# Patient Record
Sex: Female | Born: 1960
Health system: Southern US, Community
[De-identification: ages and names within clinical notes are randomized; demographics above are authoritative.]

## PROBLEM LIST (undated history)

## (undated) DIAGNOSIS — F17200 Nicotine dependence, unspecified, uncomplicated: Secondary | ICD-10-CM

## (undated) DIAGNOSIS — F419 Anxiety disorder, unspecified: Secondary | ICD-10-CM

## (undated) HISTORY — PX: ENDOMETRIAL ABLATION: SHX621

## (undated) HISTORY — PX: TUBAL LIGATION: SHX77

## (undated) HISTORY — DX: Nicotine dependence, unspecified, uncomplicated: F17.200

## (undated) HISTORY — DX: Anxiety disorder, unspecified: F41.9

---

## 2002-04-16 ENCOUNTER — Ambulatory Visit (HOSPITAL_COMMUNITY): Admission: RE | Admit: 2002-04-16 | Discharge: 2002-04-16 | Payer: Self-pay | Admitting: Obstetrics and Gynecology

## 2003-08-01 ENCOUNTER — Ambulatory Visit (HOSPITAL_COMMUNITY): Admission: RE | Admit: 2003-08-01 | Discharge: 2003-08-01 | Payer: Self-pay | Admitting: Family Medicine

## 2006-11-23 ENCOUNTER — Other Ambulatory Visit: Admission: RE | Admit: 2006-11-23 | Discharge: 2006-11-23 | Payer: Self-pay | Admitting: Obstetrics and Gynecology

## 2006-11-28 ENCOUNTER — Ambulatory Visit (HOSPITAL_COMMUNITY): Admission: RE | Admit: 2006-11-28 | Discharge: 2006-11-28 | Payer: Self-pay | Admitting: Obstetrics & Gynecology

## 2006-12-20 ENCOUNTER — Encounter: Payer: Self-pay | Admitting: Obstetrics & Gynecology

## 2006-12-21 ENCOUNTER — Ambulatory Visit (HOSPITAL_COMMUNITY): Admission: RE | Admit: 2006-12-21 | Discharge: 2006-12-21 | Payer: Self-pay | Admitting: Obstetrics & Gynecology

## 2008-03-13 ENCOUNTER — Other Ambulatory Visit: Admission: RE | Admit: 2008-03-13 | Discharge: 2008-03-13 | Payer: Self-pay | Admitting: Obstetrics and Gynecology

## 2010-02-20 ENCOUNTER — Other Ambulatory Visit: Payer: Self-pay | Admitting: Obstetrics and Gynecology

## 2010-02-20 DIAGNOSIS — Z139 Encounter for screening, unspecified: Secondary | ICD-10-CM

## 2010-02-26 ENCOUNTER — Ambulatory Visit (HOSPITAL_COMMUNITY)
Admission: RE | Admit: 2010-02-26 | Discharge: 2010-02-26 | Disposition: A | Discharge status: Home / Self Care | Payer: PRIVATE HEALTH INSURANCE | Source: Ambulatory Visit | Attending: Obstetrics and Gynecology | Admitting: Obstetrics and Gynecology

## 2010-02-26 DIAGNOSIS — Z139 Encounter for screening, unspecified: Secondary | ICD-10-CM

## 2010-02-26 DIAGNOSIS — Z1231 Encounter for screening mammogram for malignant neoplasm of breast: Secondary | ICD-10-CM | POA: Insufficient documentation

## 2010-04-16 ENCOUNTER — Other Ambulatory Visit: Payer: Self-pay | Admitting: Adult Health

## 2010-04-16 ENCOUNTER — Other Ambulatory Visit (HOSPITAL_COMMUNITY)
Admission: RE | Admit: 2010-04-16 | Discharge: 2010-04-16 | Disposition: A | Discharge status: Home / Self Care | Payer: PRIVATE HEALTH INSURANCE | Source: Ambulatory Visit | Attending: Obstetrics and Gynecology | Admitting: Obstetrics and Gynecology

## 2010-04-16 DIAGNOSIS — Z01419 Encounter for gynecological examination (general) (routine) without abnormal findings: Secondary | ICD-10-CM | POA: Insufficient documentation

## 2010-05-19 NOTE — Op Note (Signed)
NAME:  Stephanie Stevenson, Stephanie Stevenson              ACCOUNT NO.:  [id number]   MEDICAL RECORD NO.:  [id number]          PATIENT TYPE:  AMB   LOCATION:  DAY                           FACILITY:  [facility]   PHYSICIAN:  Lazaro Arms, M.D.   DATE OF BIRTH:  03/01/1960   DATE OF PROCEDURE:  12/21/2006  DATE OF DISCHARGE:                               OPERATIVE REPORT   PREOPERATIVE DIAGNOSES:  1. Menometrorrhagia.  2. Dysmenorrhea.   POSTOPERATIVE DIAGNOSES:  1. Menometrorrhagia.  2. Dysmenorrhea.   PROCEDURE:  Hysteroscopy, dilatation and curettage, endometrial  ablation.   SURGEON:  Lazaro Arms, M.D.   ANESTHESIA:  General endotracheal.   FINDINGS:  Patient had a normal endometrium.  No polyps, no fibroids, no  abnormalities.   DESCRIPTION OF OPERATION:  Patient was taken to the operating room,  placed in supine position, underwent general endotracheal anesthesia,  placed in dorsal lithotomy position, prepped and draped in the usual  sterile fashion.  Graves speculum was placed, cervix was grasped,  dilated serially to allow passage of hysteroscope.  Diagnostic  hysteroscopy was performed.  The above-noted findings were seen.  Vigorous uterine curettage was performed.  Curettings were sent to  pathology for evaluation.   ThermaChoice 3 endometrial ablation balloon was used; 14 mL of D5W were  required to maintain a pressure between 190 and 200 mmHg throughout the  procedure, heated to 88 degrees, Celsius.  Total therapy time was 9  minutes and 4 seconds.   The patient tolerated the procedure well.  She experienced minimal blood  loss.  She was taken to the recovery room in good, stable condition.  All counts correct.  She received Ancef and Toradol preoperatively.      Lazaro Arms, M.D.  Electronically Signed    LHE/MEDQ  D:  12/21/2006  T:  12/21/2006  Job:  [id number]

## 2010-05-22 NOTE — Op Note (Signed)
NAME:  Stephanie Stevenson, Stephanie Stevenson                        ACCOUNT NO.:  [id number]   MEDICAL RECORD NO.:  [id number]                   PATIENT TYPE:  AMB   LOCATION:  DAY                                  FACILITY[facility]  [facility]   PHYSICIAN:  Tilda Burrow, M.D.              DATE OF BIRTH:  November 28, 1960   DATE OF PROCEDURE:  04/16/2002  DATE OF DISCHARGE:                                 OPERATIVE REPORT   PREOPERATIVE DIAGNOSIS:  Elective sterilization.   POSTOPERATIVE DIAGNOSIS:  Elective sterilization.   PROCEDURE:  Laparoscopic tubal sterilization with Falope rings.   SURGEON:  Christin Bach, M.D.   ASSISTANT:  None.   ANESTHESIA:  General.   COMPLICATIONS:  None   FINDINGS:  Thin, long term line of adhesions from the inferior aspects of  the left ovary to the sigmoid colon but these are longstanding, smooth,  symmetric and allow mobility of both ovary and colon so they were left  alone.   DETAILS OF PROCEDURE:  The patient was taken to the operating room, prepped  and  draped for a combined abdominal and vaginal procedure, with Hulka  tenaculum attached to the cervix for uterine  manipulation. Bladder in-and-  out catheterization. An infraumbilical, 1 cm vertical incision, as well as a  transverse suprapubic 1 cm incision. Veress needle was used to introduce  pneumoperitoneum through the umbilical incision with the pneumoperitoneum  easily introduced under 10 mmHg of pressure. Introduction of the Veress  needle was done, carefully elevating the abdominal wall and orienting the  needle toward the pelvis.   The laparoscopic trocar was then carefully introduced into the abdomen using  a similar technique, and the laparoscope was used to visualize normal pelvic  anatomy with no evidence of bleeding or trauma. The suprapubic trocar was  introduced under direct visualization, and then attention was directed to  the left fallopian tube, which was identified up to its fimbriated end,  elevated and a mid-segment loop of the tube was drawn up into the Falope  ring applier, Marcaine 0.25% applied to the surface of the tube and the  Falope ring applied, inspected, and found to be in satisfactory position.  The opposite tube was then treated in a similar fashion. The mesosalpinx  beneath the Falope ring on each side was then infiltrated with approximately  3 cc of Marcaine 0.25%, using a transabdominal approach with a 22-gauge  spinal needle. Then the laparoscopic equipment was removed after instilling  200 cc of saline into  the abdomen and deflating the abdomen. Subcuticular 4-0 Dexon was used to  close the skin and Steri-Strips were placed on the skin surface. Sponge and  needle counts were correct. The patient tolerated the procedure well, was  awakened, and went to the recovery room in good condition.  Tilda Burrow, M.D.    JVF/MEDQ  D:  04/16/2002  T:  04/16/2002  Job:  [id number]

## 2010-10-09 LAB — URINALYSIS, ROUTINE W REFLEX MICROSCOPIC
Protein, ur: NEGATIVE
Urobilinogen, UA: 0.2

## 2010-10-09 LAB — CBC
HCT: 42.2
Hemoglobin: 14.2
MCHC: 33.8
MCV: 94.9
Platelets: 260
RBC: 4.44
RDW: 13.3
WBC: 9

## 2010-10-09 LAB — COMPREHENSIVE METABOLIC PANEL
ALT: 16
AST: 17
Albumin: 3.8
CO2: 26
Calcium: 9
Chloride: 105
Creatinine, Ser: 0.7
Potassium: 4
Total Bilirubin: 0.4
Total Protein: 6.6

## 2011-02-11 ENCOUNTER — Other Ambulatory Visit: Payer: Self-pay | Admitting: Adult Health

## 2011-02-11 DIAGNOSIS — Z139 Encounter for screening, unspecified: Secondary | ICD-10-CM

## 2011-03-01 ENCOUNTER — Ambulatory Visit (HOSPITAL_COMMUNITY)
Admission: RE | Admit: 2011-03-01 | Discharge: 2011-03-01 | Disposition: A | Discharge status: Home / Self Care | Payer: PRIVATE HEALTH INSURANCE | Source: Ambulatory Visit | Attending: Adult Health | Admitting: Adult Health

## 2011-03-01 DIAGNOSIS — Z1231 Encounter for screening mammogram for malignant neoplasm of breast: Secondary | ICD-10-CM | POA: Insufficient documentation

## 2011-03-01 DIAGNOSIS — Z139 Encounter for screening, unspecified: Secondary | ICD-10-CM

## 2011-06-08 ENCOUNTER — Other Ambulatory Visit: Payer: Self-pay | Admitting: Adult Health

## 2011-06-08 ENCOUNTER — Other Ambulatory Visit (HOSPITAL_COMMUNITY)
Admission: RE | Admit: 2011-06-08 | Discharge: 2011-06-08 | Disposition: A | Discharge status: Home / Self Care | Payer: PRIVATE HEALTH INSURANCE | Source: Ambulatory Visit | Attending: Obstetrics and Gynecology | Admitting: Obstetrics and Gynecology

## 2011-06-08 DIAGNOSIS — Z1159 Encounter for screening for other viral diseases: Secondary | ICD-10-CM | POA: Insufficient documentation

## 2011-06-08 DIAGNOSIS — Z01419 Encounter for gynecological examination (general) (routine) without abnormal findings: Secondary | ICD-10-CM | POA: Insufficient documentation

## 2012-06-06 ENCOUNTER — Other Ambulatory Visit: Payer: Self-pay | Admitting: Adult Health

## 2012-06-06 DIAGNOSIS — Z139 Encounter for screening, unspecified: Secondary | ICD-10-CM

## 2012-06-08 ENCOUNTER — Ambulatory Visit (HOSPITAL_COMMUNITY)
Admission: RE | Admit: 2012-06-08 | Discharge: 2012-06-08 | Disposition: A | Discharge status: Home / Self Care | Payer: BC Managed Care – PPO | Source: Ambulatory Visit | Attending: Adult Health | Admitting: Adult Health

## 2012-06-08 DIAGNOSIS — Z139 Encounter for screening, unspecified: Secondary | ICD-10-CM

## 2012-06-08 DIAGNOSIS — Z1231 Encounter for screening mammogram for malignant neoplasm of breast: Secondary | ICD-10-CM | POA: Insufficient documentation

## 2012-08-03 ENCOUNTER — Ambulatory Visit (INDEPENDENT_AMBULATORY_CARE_PROVIDER_SITE_OTHER): Payer: BC Managed Care – PPO | Admitting: Adult Health

## 2012-08-03 ENCOUNTER — Encounter: Payer: Self-pay | Admitting: Adult Health

## 2012-08-03 VITALS — BP 120/80 | HR 74 | Ht 58.5 in | Wt 159.0 lb

## 2012-08-03 DIAGNOSIS — Z1212 Encounter for screening for malignant neoplasm of rectum: Secondary | ICD-10-CM

## 2012-08-03 DIAGNOSIS — Z01419 Encounter for gynecological examination (general) (routine) without abnormal findings: Secondary | ICD-10-CM

## 2012-08-03 DIAGNOSIS — F419 Anxiety disorder, unspecified: Secondary | ICD-10-CM | POA: Insufficient documentation

## 2012-08-03 DIAGNOSIS — F17200 Nicotine dependence, unspecified, uncomplicated: Secondary | ICD-10-CM

## 2012-08-03 HISTORY — DX: Nicotine dependence, unspecified, uncomplicated: F17.200

## 2012-08-03 HISTORY — DX: Anxiety disorder, unspecified: F41.9

## 2012-08-03 LAB — HEMOCCULT GUIAC POC 1CARD (OFFICE): Fecal Occult Blood, POC: NEGATIVE

## 2012-08-03 MED ORDER — ALPRAZOLAM 0.5 MG PO TABS: 0.5000 mg | ORAL_TABLET | Freq: Every evening | ORAL | Status: DC | PRN

## 2012-08-03 NOTE — Progress Notes (Signed)
Patient ID: Stephanie Stevenson, female   DOB: 28-Oct-1960, 52 y.o.   MRN: [id number] History of Present Illness: Stephanie Stevenson is a 8 year old white female married in for a pap and physical. Had normal pap with negative HPV in 2013 and normal mammogram this June. She has smoked for years and not ready to quit, she has lost 21 pounds since last visit.  Current Medications, Allergies, Past Medical History, Past Surgical History, Family History and Social History were reviewed in [employer] record.     Review of Systems: Patient denies any headaches, blurred vision, shortness of breath, chest pain, abdominal pain, problems with bowel movements, urination, or intercourse, not having sex husband has elevated BP and ED.Marland Kitchen No joint pains she is having some anxiety and is teary at times.    Physical Exam:BP 120/80  Pulse 74  Ht 4' 10.5" (1.486 m)  Wt 159 lb (72.122 kg)  BMI 32.66 kg/m2 General:  Well developed, well nourished, no acute distress Skin:  Warm and dry.tan Neck:  Midline trachea, normal thyroid Lungs; Clear to auscultation bilaterally Breast:  No dominant palpable mass, retraction, or nipple discharge Cardiovascular: Regular rate and rhythm Abdomen:  Soft, non tender, no hepatosplenomegaly Pelvic:  External genitalia is normal in appearance.  The vagina is normal in appearance. The cervix is smooth.  Uterus is felt to be normal size, shape, and contour.  No  adnexal masses or tenderness noted. Rectal: Good sphincter tone, no polyps, or hemorrhoids felt.  Hemoccult negative. Extremities:  No swelling or varicosities noted Psych:  Alert and cooperative,seems happy but has anxiety  Impression: Yearly gyn exam-no pap Anxiety Nicotine addiction   Plan: Physical in 1 year Mammogram yearly Colonoscopy at 52  Fasting labs next year Rx xanax 0.5 mg #30 1 at hs prn with 1 refill

## 2012-08-03 NOTE — Patient Instructions (Addendum)
Anxiety and Panic Attacks Your caregiver has informed you that you are having an anxiety or panic attack. There may be many forms of this. Most of the time these attacks come suddenly and without warning. They come at any time of day, including periods of sleep, and at any time of life. They may be strong and unexplained. Although panic attacks are very scary, they are physically harmless. Sometimes the cause of your anxiety is not known. Anxiety is a protective mechanism of the body in its fight or flight mechanism. Most of these perceived danger situations are actually nonphysical situations (such as anxiety over losing a job). CAUSES  The causes of an anxiety or panic attack are many. Panic attacks may occur in otherwise healthy people given a certain set of circumstances. There may be a genetic cause for panic attacks. Some medications may also have anxiety as a side effect. SYMPTOMS  Some of the most common feelings are:  Intense terror.  Dizziness, feeling faint.  Hot and cold flashes.  Fear of going crazy.  Feelings that nothing is real.  Sweating.  Shaking.  Chest pain or a fast heartbeat (palpitations).  Smothering, choking sensations.  Feelings of impending doom and that death is near.  Tingling of extremities, this may be from over-breathing.  Altered reality (derealization).  Being detached from yourself (depersonalization). Several symptoms can be present to make up anxiety or panic attacks. DIAGNOSIS  The evaluation by your caregiver will depend on the type of symptoms you are experiencing. The diagnosis of anxiety or panic attack is made when no physical illness can be determined to be a cause of the symptoms. TREATMENT  Treatment to prevent anxiety and panic attacks may include:  Avoidance of circumstances that cause anxiety.  Reassurance and relaxation.  Regular exercise.  Relaxation therapies, such as yoga.  Psychotherapy with a psychiatrist or  therapist.  Avoidance of caffeine, alcohol and illegal drugs.  Prescribed medication. SEEK IMMEDIATE MEDICAL CARE IF:   You experience panic attack symptoms that are different than your usual symptoms.  You have any worsening or concerning symptoms. Document Released: 12/21/2004 Document Revised: 03/15/2011 Document Reviewed: 04/24/2009 [facility] Patient Information 2014 [place], [place]. Xanax 1 daily prn Physical in 1 year Mammogram yearly Colonoscopy at 52

## 2013-06-01 ENCOUNTER — Emergency Department (HOSPITAL_COMMUNITY)
Admission: EM | Admit: 2013-06-01 | Discharge: 2013-06-02 | Disposition: A | Discharge status: Home / Self Care | Payer: BC Managed Care – PPO | Attending: Emergency Medicine | Admitting: Emergency Medicine

## 2013-06-01 ENCOUNTER — Encounter (HOSPITAL_COMMUNITY): Payer: Self-pay | Admitting: Emergency Medicine

## 2013-06-01 DIAGNOSIS — F17200 Nicotine dependence, unspecified, uncomplicated: Secondary | ICD-10-CM | POA: Insufficient documentation

## 2013-06-01 DIAGNOSIS — J029 Acute pharyngitis, unspecified: Secondary | ICD-10-CM | POA: Insufficient documentation

## 2013-06-01 DIAGNOSIS — R05 Cough: Secondary | ICD-10-CM | POA: Insufficient documentation

## 2013-06-01 DIAGNOSIS — F411 Generalized anxiety disorder: Secondary | ICD-10-CM | POA: Insufficient documentation

## 2013-06-01 DIAGNOSIS — H938X9 Other specified disorders of ear, unspecified ear: Secondary | ICD-10-CM | POA: Insufficient documentation

## 2013-06-01 DIAGNOSIS — L299 Pruritus, unspecified: Secondary | ICD-10-CM

## 2013-06-01 DIAGNOSIS — R059 Cough, unspecified: Secondary | ICD-10-CM | POA: Insufficient documentation

## 2013-06-01 NOTE — ED Notes (Signed)
Right ear ache for one week, "feels like somethings moving around in there"

## 2013-06-02 MED ORDER — ANTIPYRINE-BENZOCAINE 5.4-1.4 % OT SOLN
3.0000 [drp] | Freq: Once | OTIC | Status: AC
  Administered 2013-06-02: 4 [drp] via OTIC
  Filled 2013-06-02: qty 10

## 2013-06-02 NOTE — Discharge Instructions (Signed)
You may use the drop given if this relieves your symptoms, 3-4 drops every 4 hours for itch relief.     Emergency Department Resource Guide 1) Find a Doctor and Pay Out of Pocket Although you won't have to find out who is covered by your insurance plan, it is a good idea to ask around and get recommendations. You will then need to call the office and see if the doctor you have chosen will accept you as a new patient and what types of options they offer for patients who are self-pay. Some doctors offer discounts or will set up payment plans for their patients who do not have insurance, but you will need to ask so you aren't surprised when you get to your appointment.  2) Contact Your Local Health Department Not all health departments have doctors that can see patients for sick visits, but many do, so it is worth a call to see if yours does. If you don't know where your local health department is, you can check in your phone book. The CDC also has a tool to help you locate your state's health department, and many state websites also have listings of all of their local health departments.  3) Find a Walk-in Clinic If your illness is not likely to be very severe or complicated, you may want to try a walk in clinic. These are popping up all over the country in pharmacies, drugstores, and shopping centers. They're usually staffed by nurse practitioners or physician assistants that have been trained to treat common illnesses and complaints. They're usually fairly quick and inexpensive. However, if you have serious medical issues or chronic medical problems, these are probably not your best option.  No Primary Care Doctor: - Call Health Connect at  [phone] - they can help you locate a primary care doctor that  accepts your insurance, provides certain services, etc. - Physician Referral Service- [phone]  Chronic Pain Problems: Organization         Address  Phone   Notes  [facility]  [phone] Patients need to be referred by their primary care doctor.   Medication Assistance: Organization         Address  Phone   Notes  [facility] [address] [place] [place], [place] [address] [phone] --Must be a resident of [facility] -- Must have NO insurance coverage whatsoever (no Medicaid/ Medicare, etc.) -- The pt. MUST have a primary care doctor that directs their care regularly and follows them in the community   [facility]ist  [phone]   [employer]  [phone]    Agencies that provide inexpensive medical care: Organization         Address  Phone   Notes  [facility]  [phone]   [facility]    [phone]   [facility] [address] [place], [place] [address] [phone]   [facility] [address] [place], [place] [phone]   Planned Parenthood    [phone]   [facility]    [phone]   [facility]  [address], [place] Phone:  [phone], Fax:  [phone] Hours of Operation:  9 am - 6 pm, M-F.  Also accepts Medicaid/Medicare and self-pay.  [facility]  [address], [place] Phone: [phone], Fax: [phone]. Hours of Operation:  8:30 am - 5:30 pm, M-F.  Also accepts Medicaid and self-pay.  [facility] [place] [address] [place] Phone: [phone]   [facility] [address] Natasha Bence [place], [place] [phone], Ext. [phone] Mondays & Thursdays: 7-9 AM.  First 15 patients are seen on a first come, first serve basis.    Medicaid-accepting [facility] Providers:  Organization         Address  Phone   Notes  [facility] [address], [place] [phone] Also accepts self-pay patients.  [facility] [address] Laurell Josephs [place],  [place]  [phone]   [facility] [address], [place] [phone]   [facility] [address], [place] [phone]   Renaye Rakers [address], [place]   [phone] Only accepts [place] Access [place] patients after they have their name applied to their card.   Self-Pay (no insurance) in [facility]:  Organization         Address  Phone   Notes  Sickle Cell Patients, [facility] [address] [place], [place] [phone]   [facility] [address] [place], [place] [phone]   [facility] [place]  [address], [place] [phone]   [facility]/Dr. Osei-Bonsu  [address] [place] or [address], [place] [phone] Phone number for both [place] and [place] locations is the same.  [facility]gent [facility] and [facility] [address], [place] [phone]   [facility] [address] [place] or [address] [phone]   [facility] [address], [place] [phone], phone; [phone], fax Sees patients 1st and 3rd Saturday of every month.  Must not qualify for public or private insurance (i.e. Medicaid, Medicare, [place] Health Choice, Veterans' Benefits)  Household income should be no more than 200% of the poverty level The clinic cannot treat you if you are pregnant or think you are pregnant  Sexually transmitted diseases are not treated at the clinic.    Dental Care: Organization         Address  Phone  Notes  [facility] of [facility] [address] [place], [place] [phone] Accepts children up to age 41 who are enrolled in [place] or [place] Health Choice; pregnant women with a Medicaid card; and children who have applied for Medicaid or [place] Health  Choice, but were declined, whose parents can pay a reduced fee at time of service.  [facility] of [facility]  [address], [place] [phone] Accepts children up to age 97 who are enrolled in [place] or [place] Health Choice; pregnant women with a Medicaid card; and children who have applied for Medicaid or  Health Choice, but were declined, whose parents can pay a reduced fee at time of service.  [facility]  [address] [place], [place] [phone] Patients are seen by appointment only. Walk-ins are not accepted. [facility] will see patients 50 years of age and older. Monday - Tuesday (8am-5pm) Most Wednesdays (8:30-5pm) $30 per visit, cash only  [facility] [employer] [facility]  [address], [employer] [phone])  [phone] Patients are seen by appointment only. Walk-ins are not accepted. [facility] will see patients 51 years of age and older. One Wednesday Evening (Monthly: Volunteer Based).  $30 per visit, cash only  [employer] [facility]  [phone] for adults; Children under age 504, call Graduate Pediatric Dentistry at [phone]. Children aged 714-14, please call [phone] to request a pediatric application.  Dental services are provided in all areas of dental care including fillings, crowns and bridges, complete and partial dentures, implants, gum treatment, root canals, and extractions. Preventive care is also provided. Treatment is provided to both adults and children. Patients are selected via a lottery and there is often a waiting list.   [facility] [address], [place]  [phone] [email or web address]   [facility] [address], [place], [place] [phone], Ext. [phone] Second and Fourth Thursday of each month, opens at 6:30 AM; Clinic ends at 9 AM.  Patients are seen on a first-come first-served basis, and a limited number are seen during each  clinic.   [facility]  [address], [place], [place] [phone]   Eligibility Requirements You must have lived in [place], [place], or [place] counties for at least the last three months.   You cannot be eligible for state or federal sponsored [place]healthcare insurance, including CIGNAVeterans Administration, [place]icaid, or [employer]icare.   You generally cannot be eligible for healthcare insurance through your employer.    How to apply: Eligibility screenings are held every Tuesday and Wednesday afternoon from 1:00 pm until 4:00 pm. You do not need an appointment for the interview!  [facility] [address], [place], [place] [phone]   [facility]  [phone]   [facility]  [phone]   [facility]  [phone]    Behavioral Health Resources in the Community: Intensive Outpatient Programs Organization         Address  Phone  Notes  [facility] [address], [place], [place] [phone]   [facility] [address], [place], [place] [phone]   [facility] [address], [place], [place]  [phone]   [facility] [address],  [place], [place] [phone] or [phone]   Substance Abuse Resources Organization         Address  Phone  Notes  [facility]  [phone]   [facility]  [phone]   [facility]  [phone]   [facility]  [phone]   Residential & Outpatient Substance Abuse Program  [phone]   Psychological Services Organization         Address  Phone  Notes  [facility]  [phone]- [phone]   [facility]  [phone]- [phone]   [facility] [address], [place] [phone] or [phone]    Mobile Crisis Teams Organization         Address  Phone  Notes  [facility], Mobile  Crisis Care Unit  [phone]   [facility]  [address] [place], [place] [phone]   Doristine LocksSharon DeEsch [address] [place] [phone]    Self-Help/Support Groups Organization         Address  Phone             Notes  [facility] [place] - variety of support groups  [phone]  [phone] Call for more information  Narcotics Anonymous (NA), [facility]aring [facility] [address], [employer] [place]  2 meetings at this location   Residential Sports administrator         Address  Phone  Notes  [facility] [address],    [place]  [phone]   [facility]  [address] [place] [address], [place], [place] [phone]   [facility] [address] [place], [place] [phone] Admissions: 8am-3pm M-F  [facility] [address],    [place], [place] [phone]   [facility] [address] [place], [place], [place] [phone]   [facility] [address]  [place], [place] [phone]   [facility] - Intensive Outpatient [address], [place], [place] [phone]   [facility] ([facility] [address] [place]  [place], [place] [phone] or [phone]   [facility] ([facility]) [address], [place], [place] [phone] Accepts Medicaid  [facility] [address],  [place] [phone] Substance Abuse/Addiction Treatment   [facility] Organization         Address  Phone  Notes  [facility]  [phone]   Angie Fava, PhD [address] Ervin Knack [place], [place]   [phone] or [phone]   [facility]   [address] [place], [place] [phone]   [facility] [address] [place], [place] [phone] Insurance/Medicaid/sponsorship through [facility] and Families [address]                                    [place], [place] [phone] Therapy/tele-psych/case  [facility] [address], [place] [phone]    Dr. Lolly Mustache  [phone]   [facility]  [employer] [facility] [address] [facility] Dept. 1) [address], [place] 2) [address], [place] 3)  [address], [place] [phone]  [phone]   [facility] Child Abuse Hotline (401)190-9184 or [phone] (After Hours)

## 2013-06-02 NOTE — ED Provider Notes (Signed)
Medical screening examination/treatment/procedure(s) were performed by non-physician practitioner and as supervising physician I was immediately available for consultation/collaboration.    Vida Roller, MD 06/02/13 [phone]

## 2013-06-02 NOTE — ED Provider Notes (Signed)
CSN: [id number]     Arrival date & time 06/01/13  2250 History   First MD Initiated Contact with Patient 06/01/13 2322     Chief Complaint  Patient presents with  . Otalgia     (Consider location/radiation/quality/duration/timing/severity/associated sxs/prior Treatment) HPI Comments: Stephanie Stevenson is a 53 y.o. Female presenting with a 1 week history of left ear irritation.  She describes frequent sensation of movement within the external canal.  She denies pain and decreased hearing acuity.  She has not had injury to the ear nor has she had any recent uri symptoms such as nasal congestion, itchy or watery eyes.  No ear discharge.  She has taken no medicines or found any alleviators for this complaint.  She is concerned for possible insect, especially a tick in the ear.     The history is provided by the patient.    Past Medical History  Diagnosis Date  . Anxiety 08/03/2012  . Nicotine addiction 08/03/2012   Past Surgical History  Procedure Laterality Date  . Endometrial ablation    . Cesarean section    . Tubal ligation     Family History  Problem Relation Age of Onset  . Cancer Mother   . Diabetes Mother    History  Substance Use Topics  . Smoking status: Current Every Day Smoker -- 1.00 packs/day for 20 years    Types: Cigarettes  . Smokeless tobacco: Never Used  . Alcohol Use: No   OB History   Grav Para Term Preterm Abortions TAB SAB Ect Mult Living   1 1        1      Review of Systems  Constitutional: Negative for fever and chills.  HENT: Positive for sore throat. Negative for congestion, ear discharge, ear pain, hearing loss, rhinorrhea, sinus pressure, trouble swallowing and voice change.   Eyes: Negative for discharge.  Respiratory: Positive for cough. Negative for shortness of breath, wheezing and stridor.   Cardiovascular: Negative for chest pain.  Gastrointestinal: Negative for abdominal pain.  Genitourinary: Negative.       Allergies  Review of  patient's allergies indicates no known allergies.  Home Medications   Prior to Admission medications   Medication Sig Start Date End Date Taking? Authorizing Provider  ALPRAZolam Prudy Feeler(XANAX) 0.5 MG tablet Take 1 tablet (0.5 mg total) by mouth at bedtime as needed for sleep. 08/03/12   Adline PotterJennifer A Griffin, NP   BP 135/75  Pulse 71  Temp(Src) 98 F (36.7 C) (Oral)  Ht 4\' 7"  (1.397 m)  Wt 155 lb (70.308 kg)  BMI 36.03 kg/m2  SpO2 100% Physical Exam  Constitutional: She is oriented to person, place, and time. She appears well-developed and well-nourished.  HENT:  Head: Normocephalic and atraumatic.  Right Ear: Tympanic membrane, external ear and ear canal normal. No drainage. No foreign bodies. Tympanic membrane is not injected and not bulging.  Left Ear: Tympanic membrane, external ear and ear canal normal. No drainage. No foreign bodies. Tympanic membrane is not injected and not bulging.  Nose: No mucosal edema or rhinorrhea.  Mouth/Throat: Uvula is midline, oropharynx is clear and moist and mucous membranes are normal. No oropharyngeal exudate, posterior oropharyngeal edema, posterior oropharyngeal erythema or tonsillar abscesses.  Eyes: Conjunctivae are normal.  Cardiovascular: Normal rate and normal heart sounds.   Pulmonary/Chest: Effort normal. No respiratory distress.  Musculoskeletal: Normal range of motion.  Neurological: She is alert and oriented to person, place, and time.  Skin: Skin is warm and  dry. No rash noted.  Psychiatric: She has a normal mood and affect.    ED Course  Procedures (including critical care time) Labs Review Labs Reviewed - No data to display  Imaging Review No results found.   EKG Interpretation None      MDM   Final diagnoses:  Itching of ear    Normal ear exam.  Pt was given auralgan for topical numbing which may improve itch sensation.  Prn f/u anticipated.  Referral list given for obtaining pcp.    Burgess Amor, PA-C 06/02/13 [phone]

## 2013-08-03 ENCOUNTER — Other Ambulatory Visit: Payer: Self-pay | Admitting: Adult Health

## 2013-08-03 DIAGNOSIS — Z139 Encounter for screening, unspecified: Secondary | ICD-10-CM

## 2013-08-08 ENCOUNTER — Ambulatory Visit (HOSPITAL_COMMUNITY)
Admission: RE | Admit: 2013-08-08 | Discharge: 2013-08-08 | Disposition: A | Discharge status: Home / Self Care | Payer: BC Managed Care – PPO | Source: Ambulatory Visit | Attending: Adult Health | Admitting: Adult Health

## 2013-08-08 DIAGNOSIS — Z139 Encounter for screening, unspecified: Secondary | ICD-10-CM

## 2013-08-08 DIAGNOSIS — Z1231 Encounter for screening mammogram for malignant neoplasm of breast: Secondary | ICD-10-CM | POA: Insufficient documentation

## 2013-11-05 ENCOUNTER — Encounter (HOSPITAL_COMMUNITY): Payer: Self-pay | Admitting: Emergency Medicine

## 2017-09-19 ENCOUNTER — Ambulatory Visit (HOSPITAL_COMMUNITY)
Admission: RE | Admit: 2017-09-19 | Discharge: 2017-09-19 | Disposition: A | Discharge status: Home / Self Care | Payer: BLUE CROSS/BLUE SHIELD | Source: Ambulatory Visit | Attending: Nurse Practitioner | Admitting: Nurse Practitioner

## 2017-09-19 ENCOUNTER — Other Ambulatory Visit (HOSPITAL_COMMUNITY): Payer: Self-pay | Admitting: Nurse Practitioner

## 2017-09-19 DIAGNOSIS — I700 Atherosclerosis of aorta: Secondary | ICD-10-CM | POA: Diagnosis not present

## 2017-09-19 DIAGNOSIS — R062 Wheezing: Secondary | ICD-10-CM | POA: Insufficient documentation

## 2017-09-19 DIAGNOSIS — R05 Cough: Secondary | ICD-10-CM | POA: Diagnosis not present

## 2017-09-19 DIAGNOSIS — Z6835 Body mass index (BMI) 35.0-35.9, adult: Secondary | ICD-10-CM | POA: Diagnosis not present

## 2017-09-19 DIAGNOSIS — R0602 Shortness of breath: Secondary | ICD-10-CM

## 2017-09-19 DIAGNOSIS — Z720 Tobacco use: Secondary | ICD-10-CM | POA: Diagnosis not present

## 2017-09-23 ENCOUNTER — Other Ambulatory Visit: Payer: Self-pay | Admitting: Adult Health

## 2017-09-23 DIAGNOSIS — Z1231 Encounter for screening mammogram for malignant neoplasm of breast: Secondary | ICD-10-CM

## 2017-09-26 DIAGNOSIS — I2510 Atherosclerotic heart disease of native coronary artery without angina pectoris: Secondary | ICD-10-CM | POA: Diagnosis not present

## 2017-09-26 DIAGNOSIS — R05 Cough: Secondary | ICD-10-CM | POA: Diagnosis not present

## 2017-09-26 DIAGNOSIS — Z23 Encounter for immunization: Secondary | ICD-10-CM | POA: Diagnosis not present

## 2017-09-26 DIAGNOSIS — R0602 Shortness of breath: Secondary | ICD-10-CM | POA: Diagnosis not present

## 2017-09-26 DIAGNOSIS — Y92538 Other ambulatory health services establishments as the place of occurrence of the external cause: Secondary | ICD-10-CM | POA: Diagnosis not present

## 2017-09-26 DIAGNOSIS — R7301 Impaired fasting glucose: Secondary | ICD-10-CM | POA: Diagnosis not present

## 2017-09-26 DIAGNOSIS — Z6835 Body mass index (BMI) 35.0-35.9, adult: Secondary | ICD-10-CM | POA: Diagnosis not present

## 2017-09-29 ENCOUNTER — Ambulatory Visit (HOSPITAL_COMMUNITY)
Admission: RE | Admit: 2017-09-29 | Discharge: 2017-09-29 | Disposition: A | Discharge status: Home / Self Care | Payer: BLUE CROSS/BLUE SHIELD | Source: Ambulatory Visit | Attending: Adult Health | Admitting: Adult Health

## 2017-09-29 DIAGNOSIS — Z1231 Encounter for screening mammogram for malignant neoplasm of breast: Secondary | ICD-10-CM | POA: Diagnosis not present

## 2017-10-03 DIAGNOSIS — J410 Simple chronic bronchitis: Secondary | ICD-10-CM | POA: Diagnosis not present

## 2017-10-03 DIAGNOSIS — Z0000 Encounter for general adult medical examination without abnormal findings: Secondary | ICD-10-CM | POA: Diagnosis not present

## 2017-10-03 DIAGNOSIS — E6609 Other obesity due to excess calories: Secondary | ICD-10-CM | POA: Diagnosis not present

## 2017-10-10 DIAGNOSIS — Z1212 Encounter for screening for malignant neoplasm of rectum: Secondary | ICD-10-CM | POA: Diagnosis not present

## 2017-10-10 DIAGNOSIS — Z1211 Encounter for screening for malignant neoplasm of colon: Secondary | ICD-10-CM | POA: Diagnosis not present

## 2017-10-17 DIAGNOSIS — L600 Ingrowing nail: Secondary | ICD-10-CM | POA: Diagnosis not present

## 2017-10-17 DIAGNOSIS — M79674 Pain in right toe(s): Secondary | ICD-10-CM | POA: Diagnosis not present

## 2017-10-27 ENCOUNTER — Encounter: Payer: Self-pay | Admitting: Adult Health

## 2017-10-27 ENCOUNTER — Ambulatory Visit (INDEPENDENT_AMBULATORY_CARE_PROVIDER_SITE_OTHER): Payer: BLUE CROSS/BLUE SHIELD | Admitting: Adult Health

## 2017-10-27 ENCOUNTER — Other Ambulatory Visit (HOSPITAL_COMMUNITY)
Admission: RE | Admit: 2017-10-27 | Discharge: 2017-10-27 | Disposition: A | Discharge status: Home / Self Care | Payer: BLUE CROSS/BLUE SHIELD | Source: Ambulatory Visit | Attending: Adult Health | Admitting: Adult Health

## 2017-10-27 VITALS — BP 142/81 | HR 70 | Ht 59.0 in | Wt 176.0 lb

## 2017-10-27 DIAGNOSIS — Z01419 Encounter for gynecological examination (general) (routine) without abnormal findings: Secondary | ICD-10-CM

## 2017-10-27 DIAGNOSIS — Z1211 Encounter for screening for malignant neoplasm of colon: Secondary | ICD-10-CM | POA: Diagnosis not present

## 2017-10-27 DIAGNOSIS — Z1212 Encounter for screening for malignant neoplasm of rectum: Secondary | ICD-10-CM | POA: Diagnosis not present

## 2017-10-27 DIAGNOSIS — F419 Anxiety disorder, unspecified: Secondary | ICD-10-CM

## 2017-10-27 LAB — HEMOCCULT GUIAC POC 1CARD (OFFICE): Fecal Occult Blood, POC: NEGATIVE

## 2017-10-27 MED ORDER — ALPRAZOLAM 0.5 MG PO TABS: 0.5000 mg | ORAL_TABLET | Freq: Every evening | ORAL | 1 refills | Status: DC | PRN

## 2017-10-27 NOTE — Addendum Note (Signed)
Addended by: Federico Flake A on: 10/27/2017 12:05 PM   Modules accepted: Orders

## 2017-10-27 NOTE — Progress Notes (Signed)
Patient ID: Stephanie Stevenson, female   DOB: 1960/07/08, 57 y.o.   MRN: [id number] History of Present Illness: Stephanie Stevenson is a 57 year old white female, divorced in for well woman gyn exam and pap. She had +cologuard and is to get colonoscopy, in near future. PCP Is Dr Margo Aye.   Current Medications, Allergies, Past Medical History, Past Surgical History, Family History and Social History were reviewed in [employer] record.     Review of Systems:  Patient denies any headaches, hearing loss, fatigue, blurred vision, shortness of breath, chest pain, abdominal pain, problems with bowel movements, urination, or intercourse(not having sex). No joint pain or mood swings. She requests xanax to help with sleep, has trouble at times, feels anxious..    Physical Exam:BP (!) 142/81 (BP Location: Left Arm, Patient Position: Sitting, Cuff Size: Normal)   Pulse 70   Ht 4\' 11"  (1.499 m)   Wt 176 lb (79.8 kg)   BMI 35.55 kg/m  General:  Well developed, well nourished, no acute distress Skin:  Warm and dry Neck:  Midline trachea, normal thyroid, good ROM, no lymphadenopathy Lungs; Clear to auscultation bilaterally Breast:  No dominant palpable mass, retraction, or nipple discharge Cardiovascular: Regular rate and rhythm Abdomen:  Soft, non tender, no hepatosplenomegaly Pelvic:  External genitalia is normal in appearance, no lesions.  The vagina is pale with loss of color, moisture and rugae. Urethra has no lesions or masses. The cervix is smooth, pap with HPV performed. Marland Kitchen  Uterus is felt to be normal size, shape, and contour.  No adnexal masses or tenderness noted.Bladder is non tender, no masses felt. Rectal: Good sphincter tone, no polyps, or hemorrhoids felt.  Hemoccult negative. Extremities/musculoskeletal:  No swelling or varicosities noted, no clubbing or cyanosis Psych:  No mood changes, alert and cooperative,seems happy PHQ 9 score 4, denies being suicidal.  Examination  chaperoned by Federico Flake CMA.  Impression: 1. Encounter for gynecological examination with Papanicolaou smear of cervix   2. Screening for colorectal cancer   3. Anxiety       Plan: Meds ordered this encounter  Medications  . ALPRAZolam (XANAX) 0.5 MG tablet    Sig: Take 1 tablet (0.5 mg total) by mouth at bedtime as needed for anxiety.    Dispense:  30 tablet    Refill:  1    Order Specific Question:   Supervising Provider    Answer:   Lazaro Arms [[id number]]  Labs with PCP Mammogram yearly Colonoscopy advised  Physical in 1 year Pap in 3 if normal

## 2017-10-31 DIAGNOSIS — M79674 Pain in right toe(s): Secondary | ICD-10-CM | POA: Diagnosis not present

## 2017-10-31 DIAGNOSIS — L600 Ingrowing nail: Secondary | ICD-10-CM | POA: Diagnosis not present

## 2017-10-31 LAB — CYTOLOGY - PAP
Diagnosis: NEGATIVE
HPV (WINDOPATH): NOT DETECTED

## 2017-11-02 ENCOUNTER — Encounter: Payer: Self-pay | Admitting: Gastroenterology

## 2017-12-08 DIAGNOSIS — J060 Acute laryngopharyngitis: Secondary | ICD-10-CM | POA: Diagnosis not present

## 2017-12-08 DIAGNOSIS — Z720 Tobacco use: Secondary | ICD-10-CM | POA: Diagnosis not present

## 2017-12-16 ENCOUNTER — Ambulatory Visit: Payer: BLUE CROSS/BLUE SHIELD | Admitting: Gastroenterology

## 2017-12-16 ENCOUNTER — Encounter: Payer: Self-pay | Admitting: Gastroenterology

## 2017-12-16 DIAGNOSIS — R195 Other fecal abnormalities: Secondary | ICD-10-CM | POA: Insufficient documentation

## 2017-12-16 MED ORDER — BENZONATATE 100 MG PO CAPS: ORAL_CAPSULE | ORAL | 1 refills | Status: DC

## 2017-12-16 NOTE — Progress Notes (Signed)
Subjective:    Patient ID: Stephanie Stevenson, female    DOB: 1960/06/21, 57 y.o.   MRN: [id number]  Stephanie Stevenson, John Z, MD  HPI Positive Cologuard ~ 3 mos ago. Had one baby C section. GETTING OVER A COLD. NEVER TCS. BMs: DAILY.  MAY FEEL SOB SOMETIMES.  PT DENIES FEVER, CHILLS, HEMATOCHEZIA, WEIGHT LOSS, HEMATEMESIS, nausea, vomiting, melena, diarrhea, CHEST PAIN, SHORTNESS OF BREATH, CHANGE IN BOWEL IN HABITS, constipation, abdominal pain, problems swallowing, problems with sedation, heartburn or indigestion.  Past Medical History:  Diagnosis Date  . Anxiety 08/03/2012  . Nicotine addiction 08/03/2012    Past Surgical History:  Procedure Laterality Date  . CESAREAN SECTION    . ENDOMETRIAL ABLATION    . TUBAL LIGATION      No Known Allergies  Current Outpatient Medications  Medication Sig    . ALPRAZolam (XANAX) 0.5 MG tablet Take 1 tablet (0.5 mg total) by mouth at bedtime as needed for anxiety.    . budesonide-formoterol (SYMBICORT) 80-4.5 MCG/ACT inhaler Inhale 2 puffs into the lungs 2 (two) times daily.    Marland Kitchen. Dextromethorphan-guaiFENesin (MUCINEX DM MAXIMUM STRENGTH) 60-1200 MG TB12 Take by mouth daily.    . NON FORMULARY apothecart hbp cough syrup. 5ml every 6 hrs    .       Family History  Problem Relation Age of Onset  . Cancer Mother        FEMALE ORGANS  . Diabetes Mother   . Stroke Sister   . Colon cancer Neg Hx   . Colon polyps Neg Hx     Social History   Tobacco Use  . Smoking status: Current Every Day Smoker    Packs/day: 1.00    Years: 20.00    Pack years: 20.00    Types: Cigarettes  . Smokeless tobacco: Never Used  Substance Use Topics  . Alcohol use: No  . Drug use: No    Review of Systems PER HPI OTHERWISE ALL SYSTEMS ARE NEGATIVE.    Objective:   Physical Exam Vitals signs reviewed.  Constitutional:      General: She is not in acute distress.    Appearance: She is well-developed.  HENT:     Head: Normocephalic and atraumatic.   Mouth/Throat:     Pharynx: No oropharyngeal exudate.  Eyes:     General: No scleral icterus.    Pupils: Pupils are equal, round, and reactive to light.  Neck:     Musculoskeletal: Normal range of motion and neck supple.  Cardiovascular:     Rate and Rhythm: Normal rate and regular rhythm.     Heart sounds: Normal heart sounds.  Pulmonary:     Effort: Pulmonary effort is normal. No respiratory distress.     Breath sounds: Normal breath sounds.  Abdominal:     General: Bowel sounds are normal. There is no distension.     Palpations: Abdomen is soft.     Tenderness: There is no abdominal tenderness.  Musculoskeletal: Normal range of motion.     Right lower leg: No edema.     Left lower leg: No edema.  Lymphadenopathy:     Cervical: No cervical adenopathy.  Skin:    General: Skin is warm and dry.  Neurological:     General: No focal deficit present.     Mental Status: She is alert and oriented to person, place, and time. Mental status is at baseline.  Psychiatric:        Mood and Affect: Mood  normal.        Behavior: Behavior normal.       Assessment & Plan:

## 2017-12-16 NOTE — Patient Instructions (Signed)
DRINK WATER TO KEEP YOUR URINE LIGHT YELLOW.  FOLLOW A HIGH FIBER DIET. AVOID ITEMS THAT CAUSE BLOATING & GAS. SEE INFO BELOW.  COMPLETE COLONOSCOPY IN JAN 2020.  FOLLOW UP IN THE OFFICE WILL BE SCHEDULED IF NEEDED AFTER ENDOSCOPY.  High-Fiber Diet A high-fiber diet changes your normal diet to include more whole grains, legumes, fruits, and vegetables. Changes in the diet involve replacing refined carbohydrates with unrefined foods. The calorie level of the diet is essentially unchanged. The Dietary Reference Intake (recommended amount) for adult males is 38 grams per day. For adult females, it is 25 grams per day. Pregnant and lactating women should consume 28 grams of fiber per day. Fiber is the intact part of a plant that is not broken down during digestion. Functional fiber is fiber that has been isolated from the plant to provide a beneficial effect in the body.  PURPOSE  Increase stool bulk.   Ease and regulate bowel movements.   Lower cholesterol.   REDUCE RISK OF COLON CANCER  INDICATIONS THAT YOU NEED MORE FIBER  Constipation and hemorrhoids.   Uncomplicated diverticulosis (intestine condition) and irritable bowel syndrome.   Weight management.   As a protective measure against hardening of the arteries (atherosclerosis), diabetes, and cancer.   GUIDELINES FOR INCREASING FIBER IN THE DIET  Start adding fiber to the diet slowly. A gradual increase of about 5 more grams (2 slices of whole-wheat bread, 2 servings of most fruits or vegetables, or 1 bowl of high-fiber cereal) per day is best. Too rapid an increase in fiber may result in constipation, flatulence, and bloating.   Drink enough water and fluids to keep your urine clear or pale yellow. Water, juice, or caffeine-free drinks are recommended. Not drinking enough fluid may cause constipation.   Eat a variety of high-fiber foods rather than one type of fiber.   Try to increase your intake of fiber through using  high-fiber foods rather than fiber pills or supplements that contain small amounts of fiber.   The goal is to change the types of food eaten. Do not supplement your present diet with high-fiber foods, but replace foods in your present diet.   INCLUDE A VARIETY OF FIBER SOURCES  Replace refined and processed grains with whole grains, canned fruits with fresh fruits, and incorporate other fiber sources. White rice, white breads, and most bakery goods contain little or no fiber.   Brown whole-grain rice, buckwheat oats, and many fruits and vegetables are all good sources of fiber. These include: broccoli, Brussels sprouts, cabbage, cauliflower, beets, sweet potatoes, white potatoes (skin on), carrots, tomatoes, eggplant, squash, berries, fresh fruits, and dried fruits.   Cereals appear to be the richest source of fiber. Cereal fiber is found in whole grains and bran. Bran is the fiber-rich outer coat of cereal grain, which is largely removed in refining. In whole-grain cereals, the bran remains. In breakfast cereals, the largest amount of fiber is found in those with "bran" in their names. The fiber content is sometimes indicated on the label.   You may need to include additional fruits and vegetables each day.   In baking, for 1 cup white flour, you may use the following substitutions:   1 cup whole-wheat flour minus 2 tablespoons.   1/2 cup white flour plus 1/2 cup whole-wheat flour.

## 2017-12-16 NOTE — Assessment & Plan Note (Signed)
NO BRBPR OR MELENA. DIFFERENTIAL DIAGNOSIS INCLUDES HEMORRHOIDS, COLON POLYPS, AVMs, & LESS LIKELY COLON CA.  DRINK WATER TO KEEP YOUR URINE LIGHT YELLOW. FOLLOW A HIGH FIBER DIET. AVOID ITEMS THAT CAUSE BLOATING & GAS.  HANDOUT GIVEN. COMPLETE COLONOSCOPY IN JAN 2020. DISCUSSED PROCEDURE, BENEFITS, & RISKS: < 1% chance of medication reaction, bleeding, perforation, or rupture of spleen/liver. PHENERGAN 12.5 MG IV ON PREOP.  FOLLOW UP IN THE OFFICE WILL BE SCHEDULED IF NEEDED AFTER ENDOSCOPY.

## 2017-12-19 ENCOUNTER — Telehealth: Payer: Self-pay

## 2017-12-19 ENCOUNTER — Other Ambulatory Visit: Payer: Self-pay

## 2017-12-19 DIAGNOSIS — R195 Other fecal abnormalities: Secondary | ICD-10-CM

## 2017-12-19 MED ORDER — CLENPIQ 10-3.5-12 MG-GM -GM/160ML PO SOLN: 1.0000 | Freq: Once | ORAL | 0 refills | Status: AC

## 2017-12-19 NOTE — Telephone Encounter (Signed)
Called pt to schedule TCS w/[facility]. TCS scheduled 01/23/18 at 3:00pm. Currently out of sample preps. Rx for prep sent to pharmacy. Mailed Clenpiq coupon with procedure instructions.

## 2017-12-19 NOTE — Progress Notes (Signed)
cc'd to pcp 

## 2018-01-16 ENCOUNTER — Ambulatory Visit (HOSPITAL_COMMUNITY): Payer: BLUE CROSS/BLUE SHIELD

## 2018-01-16 ENCOUNTER — Other Ambulatory Visit (HOSPITAL_COMMUNITY): Payer: Self-pay | Admitting: Nurse Practitioner

## 2018-01-16 ENCOUNTER — Ambulatory Visit (HOSPITAL_COMMUNITY)
Admission: RE | Admit: 2018-01-16 | Discharge: 2018-01-16 | Disposition: A | Discharge status: Home / Self Care | Payer: BLUE CROSS/BLUE SHIELD | Source: Ambulatory Visit | Attending: Nurse Practitioner | Admitting: Nurse Practitioner

## 2018-01-16 ENCOUNTER — Telehealth: Payer: Self-pay | Admitting: Gastroenterology

## 2018-01-16 ENCOUNTER — Encounter (HOSPITAL_COMMUNITY): Payer: Self-pay

## 2018-01-16 DIAGNOSIS — R252 Cramp and spasm: Secondary | ICD-10-CM | POA: Diagnosis not present

## 2018-01-16 DIAGNOSIS — M545 Low back pain, unspecified: Secondary | ICD-10-CM

## 2018-01-16 DIAGNOSIS — Z716 Tobacco abuse counseling: Secondary | ICD-10-CM | POA: Diagnosis not present

## 2018-01-16 DIAGNOSIS — J449 Chronic obstructive pulmonary disease, unspecified: Secondary | ICD-10-CM | POA: Diagnosis not present

## 2018-01-16 DIAGNOSIS — M47815 Spondylosis without myelopathy or radiculopathy, thoracolumbar region: Secondary | ICD-10-CM | POA: Diagnosis not present

## 2018-01-16 DIAGNOSIS — M4185 Other forms of scoliosis, thoracolumbar region: Secondary | ICD-10-CM | POA: Diagnosis not present

## 2018-01-16 NOTE — Telephone Encounter (Signed)
Pt is scheduled with [facility] for a colonoscopy on 01/23/2018. She is needing to reschedule due to having back problems. Please call her back at [phone]

## 2018-01-16 NOTE — Telephone Encounter (Signed)
Called spoke with patient and she states she is having some back issues and is working with her doctor to figure out what is going on. She did not want to r/s at this time. She states once her back is taken care of she will call us to r/s. Called carolyn in endo and LMOVM advising to cancel TCS. FYI to [facility]

## 2018-01-17 NOTE — Telephone Encounter (Signed)
REVIEWED-NO ADDITIONAL RECOMMENDATIONS. 

## 2018-01-23 ENCOUNTER — Ambulatory Visit (HOSPITAL_COMMUNITY): Admit: 2018-01-23 | Payer: BLUE CROSS/BLUE SHIELD | Admitting: Gastroenterology

## 2018-01-23 ENCOUNTER — Encounter (HOSPITAL_COMMUNITY): Payer: Self-pay

## 2018-01-23 SURGERY — COLONOSCOPY
Anesthesia: Moderate Sedation

## 2018-05-18 DIAGNOSIS — Z716 Tobacco abuse counseling: Secondary | ICD-10-CM | POA: Diagnosis not present

## 2018-05-18 DIAGNOSIS — R7301 Impaired fasting glucose: Secondary | ICD-10-CM | POA: Diagnosis not present

## 2018-05-22 DIAGNOSIS — J449 Chronic obstructive pulmonary disease, unspecified: Secondary | ICD-10-CM | POA: Diagnosis not present

## 2018-05-22 DIAGNOSIS — M545 Low back pain: Secondary | ICD-10-CM | POA: Diagnosis not present

## 2018-05-22 DIAGNOSIS — E782 Mixed hyperlipidemia: Secondary | ICD-10-CM | POA: Diagnosis not present

## 2018-05-22 DIAGNOSIS — M62830 Muscle spasm of back: Secondary | ICD-10-CM | POA: Diagnosis not present

## 2019-02-13 ENCOUNTER — Other Ambulatory Visit (HOSPITAL_COMMUNITY): Payer: Self-pay | Admitting: Adult Health

## 2019-02-13 DIAGNOSIS — Z1231 Encounter for screening mammogram for malignant neoplasm of breast: Secondary | ICD-10-CM

## 2019-02-22 ENCOUNTER — Ambulatory Visit (HOSPITAL_COMMUNITY): Payer: BLUE CROSS/BLUE SHIELD

## 2019-03-01 ENCOUNTER — Ambulatory Visit (HOSPITAL_COMMUNITY)
Admission: RE | Admit: 2019-03-01 | Discharge: 2019-03-01 | Disposition: A | Discharge status: Home / Self Care | Payer: 59 | Source: Ambulatory Visit | Attending: Adult Health | Admitting: Adult Health

## 2019-03-01 ENCOUNTER — Other Ambulatory Visit: Payer: Self-pay

## 2019-03-01 DIAGNOSIS — Z1231 Encounter for screening mammogram for malignant neoplasm of breast: Secondary | ICD-10-CM

## 2019-04-18 ENCOUNTER — Telehealth: Payer: Self-pay | Admitting: Adult Health

## 2019-04-18 NOTE — Telephone Encounter (Signed)

## 2019-04-19 ENCOUNTER — Ambulatory Visit (INDEPENDENT_AMBULATORY_CARE_PROVIDER_SITE_OTHER): Payer: 59 | Admitting: Adult Health

## 2019-04-19 ENCOUNTER — Other Ambulatory Visit: Payer: Self-pay

## 2019-04-19 ENCOUNTER — Encounter: Payer: Self-pay | Admitting: Adult Health

## 2019-04-19 VITALS — BP 133/73 | HR 70 | Ht <= 58 in | Wt 181.2 lb

## 2019-04-19 DIAGNOSIS — Z1211 Encounter for screening for malignant neoplasm of colon: Secondary | ICD-10-CM

## 2019-04-19 DIAGNOSIS — F419 Anxiety disorder, unspecified: Secondary | ICD-10-CM

## 2019-04-19 DIAGNOSIS — Z01419 Encounter for gynecological examination (general) (routine) without abnormal findings: Secondary | ICD-10-CM | POA: Diagnosis not present

## 2019-04-19 DIAGNOSIS — Z1212 Encounter for screening for malignant neoplasm of rectum: Secondary | ICD-10-CM

## 2019-04-19 LAB — HEMOCCULT GUIAC POC 1CARD (OFFICE): Fecal Occult Blood, POC: NEGATIVE

## 2019-04-19 MED ORDER — ALPRAZOLAM 0.5 MG PO TABS: 0.5000 mg | ORAL_TABLET | Freq: Every evening | ORAL | 0 refills | Status: DC | PRN

## 2019-04-19 NOTE — Progress Notes (Signed)
Patient ID: Stephanie Stevenson, female   DOB: [birth year], 59 y.o.   MRN: [id number] History of Present Illness: Stephanie Stevenson is a 59 year old white female,divorced, PM in for a well woman gyn exam. She had a normal pap with negative HPV 10/27/17. She requests refill on xanax,no Rx's since 2019.  PCP is Dr Margo Aye.   Current Medications, Allergies, Past Medical History, Past Surgical History, Family History and Social History were reviewed in [employer] record.     Review of Systems:  Patient denies any headaches, hearing loss, fatigue, blurred vision, shortness of breath, chest pain, abdominal pain, problems with bowel movements, urination, or intercourse(not active). No joint pain or mood swings. Occasional  anxiety   Physical Exam:BP 133/73 (BP Location: Left Arm, Patient Position: Sitting, Cuff Size: Normal)   Pulse 70   Ht 4\' 9"  (1.448 m)   Wt 181 lb 3.2 oz (82.2 kg)   BMI 39.21 kg/m  General:  Well developed, well nourished, no acute distress Skin:  Warm and dry,tan Neck:  Midline trachea, normal thyroid, good ROM, no lymphadenopathy Lungs; Clear to auscultation bilaterally Breast:  No dominant palpable mass, retraction, or nipple discharge Cardiovascular: Regular rate and rhythm Abdomen:  Soft, non tender, no hepatosplenomegaly Pelvic:  External genitalia is normal in appearance, no lesions.  The vagina is pale with loss of moisture and rugae . Urethra has no lesions or masses. The cervix is smooth.  Uterus is felt to be normal size, shape, and contour.  No adnexal masses or tenderness noted.Bladder is non tender, no masses felt. Rectal: Good sphincter tone, no polyps, or hemorrhoids felt.  Hemoccult negative. Extremities/musculoskeletal:  No swelling or varicosities noted, no clubbing or cyanosis Psych:  No mood changes, alert and cooperative,seems happy AA 0 Fall risk is low PHQ 9 score is 0 Examination chaperoned by LPN  Impression and Plan: 1.  Encounter for well woman exam with routine gynecological exam Pap and physical in 1 year Mammogram yearly Labs with PCP  2. Screening for colorectal cancer Talk with PCP about colonoscopy   3. Anxiety Refill xanax Meds ordered this encounter  Medications  . ALPRAZolam (XANAX) 0.5 MG tablet    Sig: Take 1 tablet (0.5 mg total) by mouth at bedtime as needed for anxiety.    Dispense:  30 tablet    Refill:  0    Order Specific Question:   Supervising Provider    Answer:   Stoney Bang H [[id number]]

## 2019-12-06 IMAGING — MG DIGITAL SCREENING BILATERAL MAMMOGRAM WITH TOMO AND CAD
8 series · 8 of 24 positions shown · non-contrast
Comparison: Previous exam(s).

ACR Breast Density Category a: The breast tissue is almost entirely
fatty.

CLINICAL DATA: Screening.

EXAM:
DIGITAL SCREENING BILATERAL MAMMOGRAM WITH TOMO AND CAD

[R MLO synth-2D]
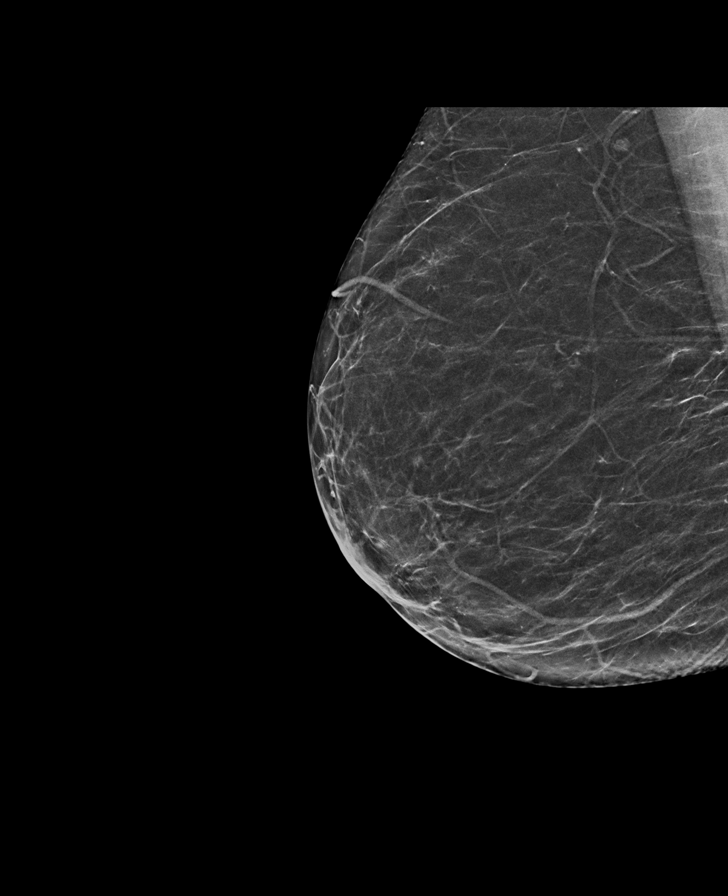

[L CC synth-2D]
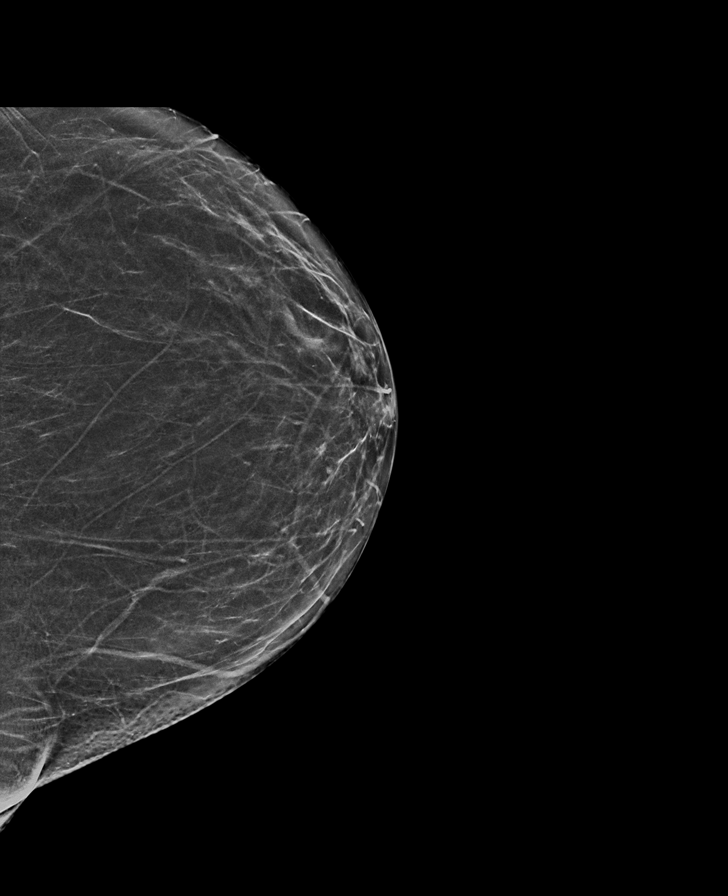

[L MLO synth-2D]
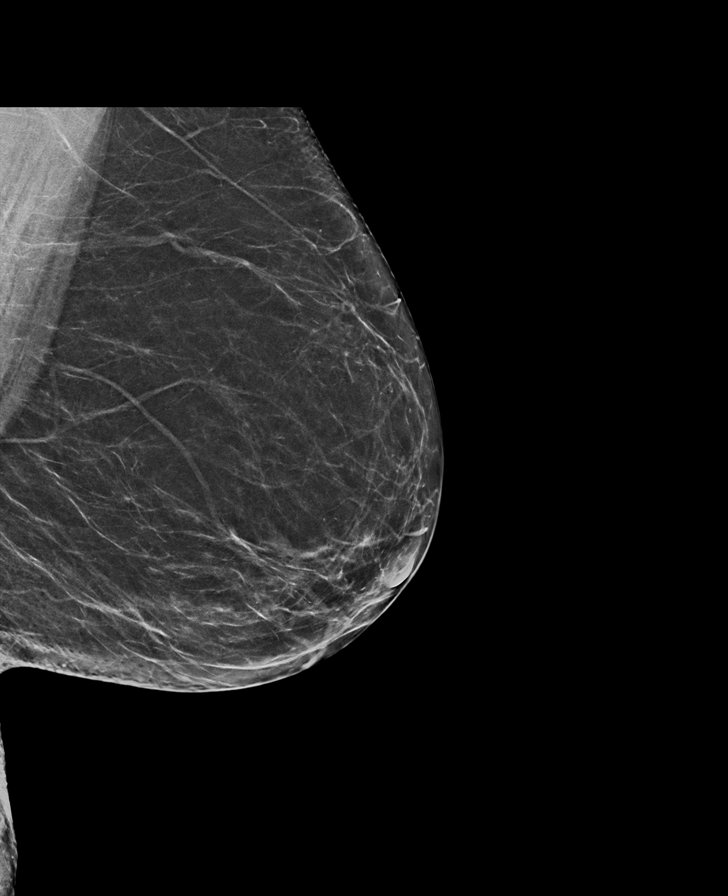

[R CC synth-2D]
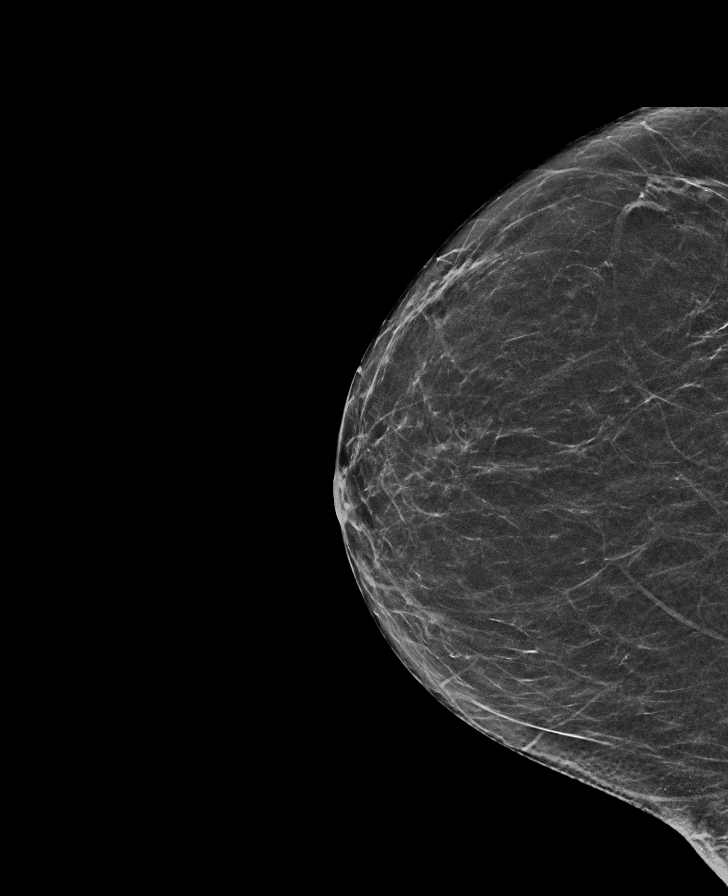

[L MLO tomo · tomo slice 34/67.0]
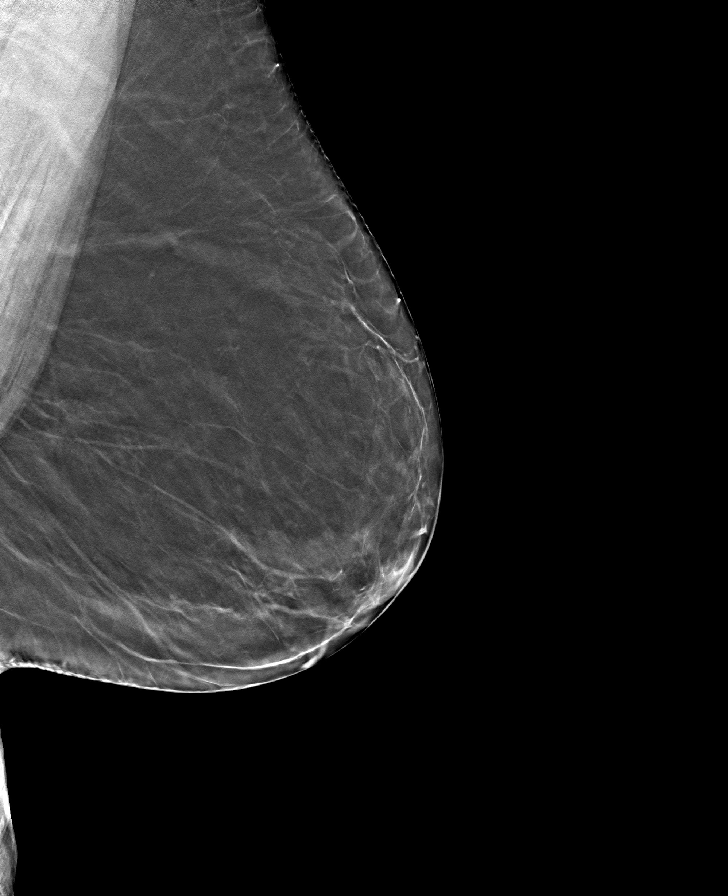

[L CC tomo · tomo slice 31/60.0]
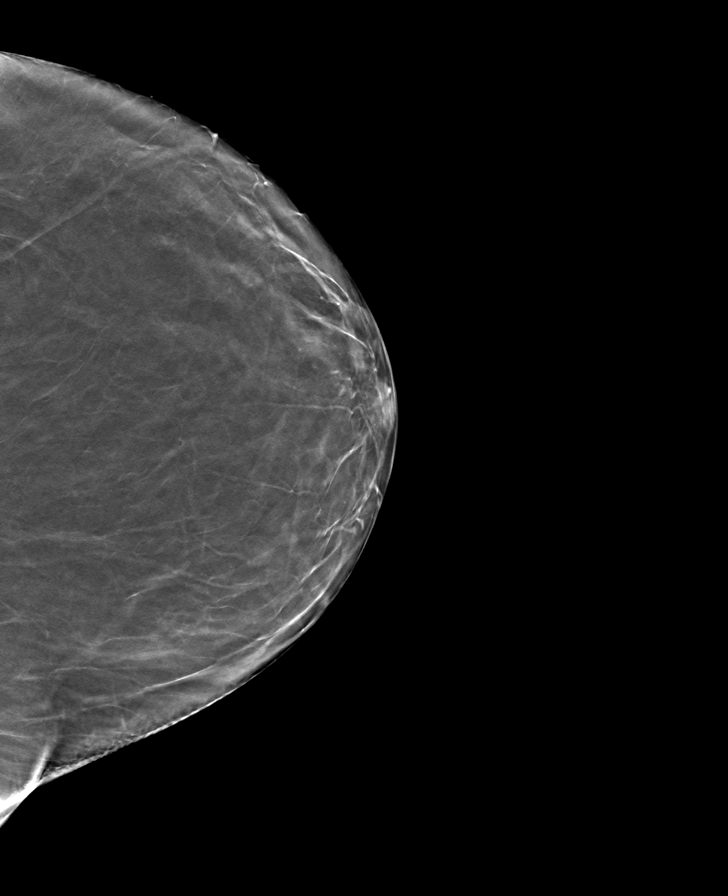

[R CC tomo · tomo slice 28/55.0]
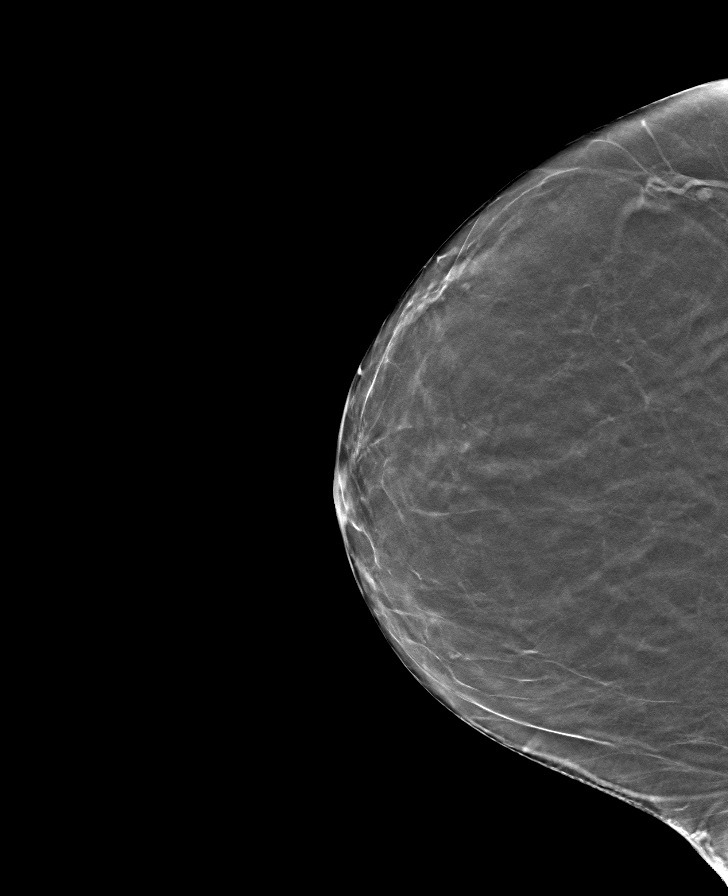

[R MLO tomo · tomo slice 31/61.0]
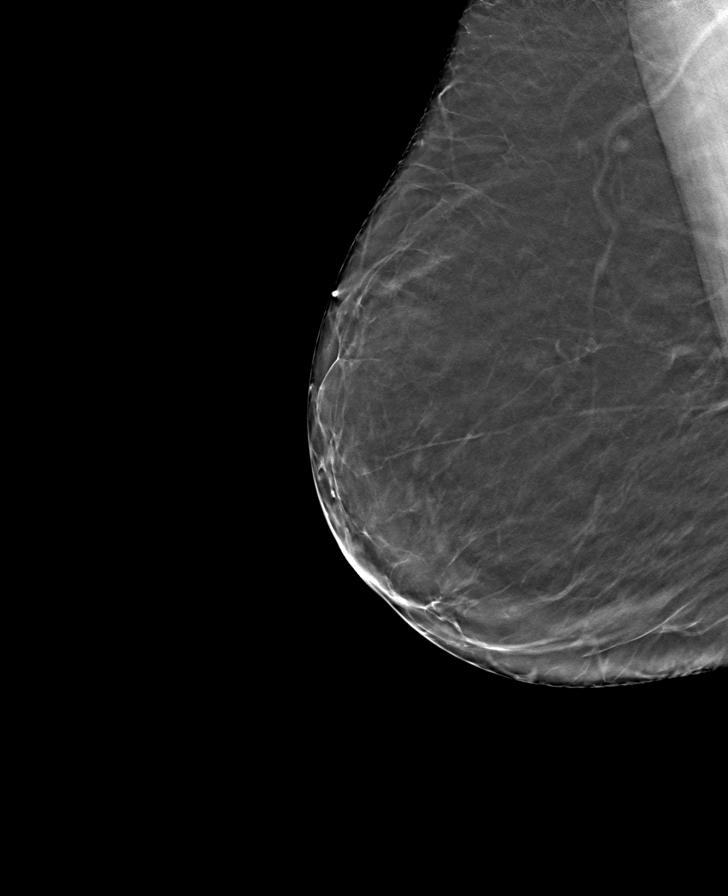

[8 of 24 positions shown; findings below may reference images not displayed]

FINDINGS: There are no findings suspicious for malignancy. Images were
processed with CAD.
IMPRESSION: No mammographic evidence of malignancy. A result letter of this
screening mammogram will be mailed directly to the patient.

RECOMMENDATION:
Screening mammogram in one year. (Code:[id number]-[id number])

BI-RADS CATEGORY  1: Negative.

## 2020-03-06 ENCOUNTER — Other Ambulatory Visit (HOSPITAL_COMMUNITY): Payer: Self-pay | Admitting: Adult Health

## 2020-03-06 DIAGNOSIS — Z1231 Encounter for screening mammogram for malignant neoplasm of breast: Secondary | ICD-10-CM

## 2020-03-17 ENCOUNTER — Ambulatory Visit (HOSPITAL_COMMUNITY)
Admission: RE | Admit: 2020-03-17 | Discharge: 2020-03-17 | Disposition: A | Discharge status: Home / Self Care | Payer: 59 | Source: Ambulatory Visit | Attending: Adult Health | Admitting: Adult Health

## 2020-03-17 DIAGNOSIS — Z1231 Encounter for screening mammogram for malignant neoplasm of breast: Secondary | ICD-10-CM | POA: Diagnosis not present

## 2020-05-01 ENCOUNTER — Other Ambulatory Visit (HOSPITAL_COMMUNITY)
Admission: RE | Admit: 2020-05-01 | Discharge: 2020-05-01 | Disposition: A | Discharge status: Home / Self Care | Payer: 59 | Source: Ambulatory Visit | Attending: Adult Health | Admitting: Adult Health

## 2020-05-01 ENCOUNTER — Ambulatory Visit (INDEPENDENT_AMBULATORY_CARE_PROVIDER_SITE_OTHER): Payer: 59 | Admitting: Adult Health

## 2020-05-01 ENCOUNTER — Encounter: Payer: Self-pay | Admitting: Adult Health

## 2020-05-01 ENCOUNTER — Other Ambulatory Visit: Payer: Self-pay

## 2020-05-01 VITALS — BP 170/82 | HR 72 | Ht <= 58 in | Wt 175.0 lb

## 2020-05-01 DIAGNOSIS — Z01419 Encounter for gynecological examination (general) (routine) without abnormal findings: Secondary | ICD-10-CM

## 2020-05-01 DIAGNOSIS — F419 Anxiety disorder, unspecified: Secondary | ICD-10-CM | POA: Diagnosis not present

## 2020-05-01 DIAGNOSIS — Z1211 Encounter for screening for malignant neoplasm of colon: Secondary | ICD-10-CM

## 2020-05-01 LAB — HEMOCCULT GUIAC POC 1CARD (OFFICE): Fecal Occult Blood, POC: NEGATIVE

## 2020-05-01 MED ORDER — ALPRAZOLAM 0.5 MG PO TABS: 0.5000 mg | ORAL_TABLET | Freq: Every evening | ORAL | 0 refills | Status: DC | PRN

## 2020-05-01 NOTE — Progress Notes (Signed)
Patient ID: Stephanie Stevenson, female   DOB: Mar 15, 1960, 60 y.o.   MRN: [id number] History of Present Illness: Stephanie Stevenson is a 46 year old white female, divorced, PM in for a well woman gyn exam and pap. She requests refill on xanax. PCP is Dr Margo Aye.   Current Medications, Allergies, Past Medical History, Past Surgical History, Family History and Social History were reviewed in [employer] record.     Review of Systems: Patient denies any headaches, hearing loss, fatigue, blurred vision, shortness of breath, chest pain, abdominal pain, problems with bowel movements, urination, or intercourse.(not active) No joint pain or mood swings. Denies any vaginal bleeding Has chronic cough got tessalon from PCP, did not help, try delsym   Physical Exam:BP (!) 170/82 (BP Location: Left Arm, Patient Position: Sitting, Cuff Size: Normal)   Pulse 72   Ht 4\' 10"  (1.473 m)   Wt 175 lb (79.4 kg)   BMI 36.58 kg/m  General:  Well developed, well nourished, no acute distress Skin:  Warm and dry,tan Neck:  Midline trachea, normal thyroid, good ROM, no lymphadenopathy Lungs; Clear to auscultation bilaterally Breast:  No dominant palpable mass, retraction, or nipple discharge Cardiovascular: Regular rate and rhythm Abdomen:  Soft, non tender, no hepatosplenomegaly Pelvic:  External genitalia is normal in appearance, no lesions.  The vagina is pale with loss of moisture and rugae. Urethra has no lesions or masses. The cervix is smooth, pap with HR HPV genotyping performed.  Uterus is felt to be normal size, shape, and contour.  No adnexal masses or tenderness noted.Bladder is non tender, no masses felt. Rectal: Good sphincter tone, no polyps, or hemorrhoids felt.  Hemoccult negative. Extremities/musculoskeletal:  No swelling or varicosities noted, no clubbing or cyanosis Psych:  No mood changes, alert and cooperative,seems happy AA is 0  Fall risk is low PHQ 9 score is 2 GAD 7 score is 0   Upstream - 05/01/20 1140      Pregnancy Intention Screening   Does the patient want to become pregnant in the next year? No    Does the patient's partner want to become pregnant in the next year? No    Would the patient like to discuss contraceptive options today? No      Contraception Wrap Up   Current Method Female Sterilization    End Method Abstinence;Female Sterilization    Contraception Counseling Provided No         Examination chaperoned by 05/03/20 LPN  Impression and Plan: 1. Encounter for gynecological examination with Papanicolaou smear of cervix Pap sent Physical in 1 year Pap in 3 if normal Labs with PCP Mammogram yearly Colonoscopy advised but declines for now  2. Encounter for screening fecal occult blood testing   3. Anxiety Refilled xanax Meds ordered this encounter  Medications  . ALPRAZolam (XANAX) 0.5 MG tablet    Sig: Take 1 tablet (0.5 mg total) by mouth at bedtime as needed for anxiety.    Dispense:  30 tablet    Refill:  0    Order Specific Question:   Supervising Provider    Answer:   Marchelle Folks H [[id number]]

## 2020-05-02 LAB — CYTOLOGY - PAP
Adequacy: ABSENT
Comment: NEGATIVE
Diagnosis: NEGATIVE
High risk HPV: NEGATIVE

## 2021-03-04 ENCOUNTER — Other Ambulatory Visit (HOSPITAL_COMMUNITY): Payer: Self-pay | Admitting: Adult Health

## 2021-03-04 DIAGNOSIS — Z1231 Encounter for screening mammogram for malignant neoplasm of breast: Secondary | ICD-10-CM

## 2021-03-19 ENCOUNTER — Ambulatory Visit (HOSPITAL_COMMUNITY)
Admission: RE | Admit: 2021-03-19 | Discharge: 2021-03-19 | Disposition: A | Discharge status: Home / Self Care | Payer: 59 | Source: Ambulatory Visit | Attending: Adult Health | Admitting: Adult Health

## 2021-03-19 ENCOUNTER — Other Ambulatory Visit: Payer: Self-pay

## 2021-03-19 DIAGNOSIS — Z1231 Encounter for screening mammogram for malignant neoplasm of breast: Secondary | ICD-10-CM | POA: Insufficient documentation

## 2021-05-04 ENCOUNTER — Ambulatory Visit (INDEPENDENT_AMBULATORY_CARE_PROVIDER_SITE_OTHER): Payer: 59 | Admitting: Adult Health

## 2021-05-04 ENCOUNTER — Encounter: Payer: Self-pay | Admitting: Adult Health

## 2021-05-04 VITALS — BP 156/92 | HR 79 | Ht <= 58 in | Wt 184.5 lb

## 2021-05-04 DIAGNOSIS — F419 Anxiety disorder, unspecified: Secondary | ICD-10-CM

## 2021-05-04 DIAGNOSIS — Z1211 Encounter for screening for malignant neoplasm of colon: Secondary | ICD-10-CM | POA: Diagnosis not present

## 2021-05-04 DIAGNOSIS — L700 Acne vulgaris: Secondary | ICD-10-CM | POA: Insufficient documentation

## 2021-05-04 DIAGNOSIS — Z01419 Encounter for gynecological examination (general) (routine) without abnormal findings: Secondary | ICD-10-CM

## 2021-05-04 DIAGNOSIS — F17200 Nicotine dependence, unspecified, uncomplicated: Secondary | ICD-10-CM

## 2021-05-04 DIAGNOSIS — Z0000 Encounter for general adult medical examination without abnormal findings: Secondary | ICD-10-CM

## 2021-05-04 DIAGNOSIS — N907 Vulvar cyst: Secondary | ICD-10-CM | POA: Diagnosis not present

## 2021-05-04 LAB — HEMOCCULT GUIAC POC 1CARD (OFFICE): Fecal Occult Blood, POC: NEGATIVE

## 2021-05-04 MED ORDER — ALPRAZOLAM 0.5 MG PO TABS: 0.5000 mg | ORAL_TABLET | Freq: Every evening | ORAL | 0 refills | Status: DC | PRN

## 2021-05-04 NOTE — Progress Notes (Signed)
Patient ID: Stephanie Stevenson, female   DOB: [birth year], 61 y.o.   MRN: [id number] ?History of Present Illness: ?Stephanie Stevenson is a 61 year old white female,divorced, PM in for well woman gyn exam. Her ex husband died in 01-13-2023 and she is teary today talking about it.They were still friends. ?PCP is Dr Margo Aye. ? ?Lab Results  ?Component Value Date  ? DIAGPAP  05/01/2020  ?  - Negative for intraepithelial lesion or malignancy (NILM)  ? HPV NOT DETECTED 10/27/2017  ? HPVHIGH Negative 05/01/2020  ?  ?Current Medications, Allergies, Past Medical History, Past Surgical History, Family History and Social History were reviewed in [employer] record.   ? ? ?Review of Systems: ?Patient denies any headaches, hearing loss, fatigue, blurred vision, shortness of breath, chest pain, abdominal pain, problems with bowel movements, urination, or intercourse(not active). No joint pain or mood swings.  ?Denies any vaginal bleeding ? ? ?Physical Exam:BP (!) 156/92 (BP Location: Left Arm, Patient Position: Sitting, Cuff Size: Normal)   Pulse 79   Ht 4\' 10"  (1.473 m)   Wt 184 lb 8 oz (83.7 kg)   BMI 38.56 kg/m?   ?General:  Well developed, well nourished, no acute distress ?Skin:  Warm and dry,tan ?Neck:  Midline trachea, normal thyroid, good ROM, no lymphadenopathy ?Lungs; Clear to auscultation bilaterally ?Breast:  No dominant palpable mass, retraction, or nipple discharge ?Cardiovascular: Regular rate and rhythm ?Abdomen:  Soft, non tender, no hepatosplenomegaly ?Pelvic:  External genitalia is normal in appearance, has epidermal cyst right labia and several black heads on left, easily expressed 2.  The vagina is pale with loss of rugae. Urethra has no lesions or masses. The cervix is smooth.  Uterus is felt to be normal size, shape, and contour.  No adnexal masses or tenderness noted.Bladder is non tender, no masses felt. ?Rectal: Good sphincter tone, no polyps, or hemorrhoids felt.  Hemoccult  negative. ?Extremities/musculoskeletal:  No swelling or varicosities noted, no clubbing or cyanosis ?Psych:  No mood changes, alert and cooperative,seems happy ?AA is 0 ?Fall risk is low ? ?  05/04/2021  ? 10:40 AM 05/01/2020  ? 11:40 AM 04/19/2019  ?  1:42 PM  ?Depression screen PHQ 2/9  ?Decreased Interest 0 0 0  ?Down, Depressed, Hopeless 0 0 0  ?PHQ - 2 Score 0 0 0  ?Altered sleeping 0 0 0  ?Tired, decreased energy 0 2 0  ?Change in appetite 0 0 0  ?Feeling bad or failure about yourself  0 0 0  ?Trouble concentrating 0 0 0  ?Moving slowly or fidgety/restless 0 0 0  ?Suicidal thoughts 0 0 0  ?PHQ-9 Score 0 2 0  ?Difficult doing work/chores   Not difficult at all  ?  ? ?  05/04/2021  ? 10:40 AM 05/01/2020  ? 11:40 AM 04/19/2019  ?  1:42 PM  ?GAD 7 : Generalized Anxiety Score  ?Nervous, Anxious, on Edge 2 0 0  ?Control/stop worrying 2 0 0  ?Worry too much - different things 2 0 0  ?Trouble relaxing 0 0 0  ?Restless 0 0 0  ?Easily annoyed or irritable 0 0 0  ?Afraid - awful might happen 0 0 0  ?Total GAD 7 Score 6 0 0  ?Anxiety Difficulty   Not difficult at all  ? ?  ? Upstream - 05/04/21 1039   ? ?  ? Pregnancy Intention Screening  ? Does the patient want to become pregnant in the next year? N/A   ?  Does the patient's partner want to become pregnant in the next year? N/A   ? Would the patient like to discuss contraceptive options today? N/A   ?  ? Contraception Wrap Up  ? Current Method Female Sterilization   ? End Method Female Sterilization   ? Contraception Counseling Provided No   ? ?  ?  ? ?  ? Examination chaperoned by Malachy Mood LPN ? ? ?Impression and Plan: ?1. Encounter for well woman exam with routine gynecological exam ?Physical in 1 year ?Pap in 2025 ?Mammogram yearly, was normal in March 2023 ?Lab with PCP  ?Cologuard per PCP ? ?2. Encounter for screening fecal occult blood testing ?Hemoccult was negative ? ?3. Epidermal cyst of vulva ?Leave alone, if starts to bother, can I&D ? ?4. [place] ? ?5.  Anxiety ?She requests refill on xanax ?Meds ordered this encounter  ?Medications  ? ALPRAZolam (XANAX) 0.5 MG tablet  ?  Sig: Take 1 tablet (0.5 mg total) by mouth at bedtime as needed for anxiety.  ?  Dispense:  30 tablet  ?  Refill:  0  ?  Order Specific Question:   Supervising Provider  ?  Answer:   Duane Lope H [[id number]]  ?  ? ?6. Smoker ?She is not ready to quit, she days  ? ? ? ?  ?  ?

## 2021-12-01 DIAGNOSIS — E782 Mixed hyperlipidemia: Secondary | ICD-10-CM | POA: Diagnosis not present

## 2021-12-01 DIAGNOSIS — I10 Essential (primary) hypertension: Secondary | ICD-10-CM | POA: Diagnosis not present

## 2021-12-01 DIAGNOSIS — Z139 Encounter for screening, unspecified: Secondary | ICD-10-CM | POA: Diagnosis not present

## 2021-12-01 DIAGNOSIS — R7301 Impaired fasting glucose: Secondary | ICD-10-CM | POA: Diagnosis not present

## 2021-12-04 DIAGNOSIS — E782 Mixed hyperlipidemia: Secondary | ICD-10-CM | POA: Diagnosis not present

## 2021-12-04 DIAGNOSIS — R69 Illness, unspecified: Secondary | ICD-10-CM | POA: Diagnosis not present

## 2021-12-04 DIAGNOSIS — I10 Essential (primary) hypertension: Secondary | ICD-10-CM | POA: Diagnosis not present

## 2021-12-04 DIAGNOSIS — K219 Gastro-esophageal reflux disease without esophagitis: Secondary | ICD-10-CM | POA: Diagnosis not present

## 2021-12-04 DIAGNOSIS — R7303 Prediabetes: Secondary | ICD-10-CM | POA: Diagnosis not present

## 2021-12-04 DIAGNOSIS — J449 Chronic obstructive pulmonary disease, unspecified: Secondary | ICD-10-CM | POA: Diagnosis not present

## 2021-12-04 DIAGNOSIS — M62830 Muscle spasm of back: Secondary | ICD-10-CM | POA: Diagnosis not present

## 2021-12-04 DIAGNOSIS — M519 Unspecified thoracic, thoracolumbar and lumbosacral intervertebral disc disorder: Secondary | ICD-10-CM | POA: Diagnosis not present

## 2022-02-22 ENCOUNTER — Other Ambulatory Visit (HOSPITAL_COMMUNITY): Payer: Self-pay | Admitting: Adult Health

## 2022-02-22 DIAGNOSIS — Z1231 Encounter for screening mammogram for malignant neoplasm of breast: Secondary | ICD-10-CM

## 2022-03-12 ENCOUNTER — Other Ambulatory Visit: Payer: Self-pay | Admitting: Adult Health

## 2022-03-22 ENCOUNTER — Ambulatory Visit (HOSPITAL_COMMUNITY)
Admission: RE | Admit: 2022-03-22 | Discharge: 2022-03-22 | Disposition: A | Discharge status: Home / Self Care | Payer: 59 | Source: Ambulatory Visit | Attending: Adult Health | Admitting: Adult Health

## 2022-03-22 DIAGNOSIS — Z1231 Encounter for screening mammogram for malignant neoplasm of breast: Secondary | ICD-10-CM | POA: Insufficient documentation

## 2022-03-25 ENCOUNTER — Telehealth: Payer: Self-pay | Admitting: *Deleted

## 2022-03-25 NOTE — Telephone Encounter (Signed)
-----   Message from Estill Dooms, NP sent at 03/25/2022  1:31 PM EDT ----- Let her know mammogram was negative,repeat in 1 year THX

## 2022-03-25 NOTE — Telephone Encounter (Signed)
Pt aware mammogram was negative and to repeat in 1 year. Pt voiced understanding. Call was transferred to [facility] to schedule physical after 05/05/22. [place]

## 2022-04-13 ENCOUNTER — Other Ambulatory Visit: Payer: Self-pay | Admitting: Adult Health

## 2022-05-11 ENCOUNTER — Ambulatory Visit (INDEPENDENT_AMBULATORY_CARE_PROVIDER_SITE_OTHER): Payer: 59 | Admitting: Adult Health

## 2022-05-11 ENCOUNTER — Encounter: Payer: Self-pay | Admitting: Adult Health

## 2022-05-11 VITALS — BP 176/84 | HR 72 | Ht 58.5 in | Wt 189.0 lb

## 2022-05-11 DIAGNOSIS — F17200 Nicotine dependence, unspecified, uncomplicated: Secondary | ICD-10-CM | POA: Diagnosis not present

## 2022-05-11 DIAGNOSIS — R232 Flushing: Secondary | ICD-10-CM

## 2022-05-11 DIAGNOSIS — Z01419 Encounter for gynecological examination (general) (routine) without abnormal findings: Secondary | ICD-10-CM

## 2022-05-11 DIAGNOSIS — Z1211 Encounter for screening for malignant neoplasm of colon: Secondary | ICD-10-CM | POA: Diagnosis not present

## 2022-05-11 LAB — HEMOCCULT GUIAC POC 1CARD (OFFICE): Fecal Occult Blood, POC: NEGATIVE

## 2022-05-11 NOTE — Progress Notes (Signed)
Patient ID: Stephanie Stevenson, female   DOB: May 18, 1960, 62 y.o.   MRN: [id number] History of Present Illness: Stephanie Stevenson is a 62 year old white female, widowed, PM in for a well woman gyn exam. She is having 5-6 hot flashes in a 24 hour period. She is staying with her mom who has had cancer.  Last pap was negative HPV, NILM 05/01/20.  PCP is Leone Payor NP    Current Medications, Allergies, Past Medical History, Past Surgical History, Family History and Social History were reviewed in [employer] record.     Review of Systems: Patient denies any headaches, hearing loss, fatigue, blurred vision, shortness of breath, chest pain, abdominal pain, problems with bowel movements, urination, or intercourse(not active). No joint pain or mood swings.  +hot flashes Has gained some weight Denies any vaginal bleeding   Physical Exam:BP (!) 176/84 (BP Location: Right Arm, Patient Position: Sitting, Cuff Size: Normal)   Pulse 72   Ht 4' 10.5" (1.486 m)   Wt 189 lb (85.7 kg)   BMI 38.83 kg/m   General:  Well developed, well nourished, no acute distress Skin:  Warm and dry Neck:  Midline trachea, normal thyroid, good ROM, no lymphadenopathy Lungs; Clear to auscultation bilaterally Breast:  No dominant palpable mass, retraction, or nipple discharge Cardiovascular: Regular rate and rhythm Abdomen:  Soft, non tender, no hepatosplenomegaly Pelvic:  External genitalia is normal in appearance, no lesions.  The vagina is pale. Urethra has no lesions or masses. The cervix is smooth.  Uterus is felt to be normal size, shape, and contour.  No adnexal masses or tenderness noted.Bladder is non tender, no masses felt. Rectal: Good sphincter tone, no polyps, or hemorrhoids felt.  Hemoccult negative. Extremities/musculoskeletal:  No swelling or varicosities noted, no clubbing or cyanosis Psych:  No mood changes, alert and cooperative,seems happy AA is 0 Fall risk is low    05/11/2022    8:40 AM  05/04/2021   10:40 AM 05/01/2020   11:40 AM  Depression screen PHQ 2/9  Decreased Interest 0 0 0  Down, Depressed, Hopeless 0 0 0  PHQ - 2 Score 0 0 0  Altered sleeping 0 0 0  Tired, decreased energy 0 0 2  Change in appetite 3 0 0  Feeling bad or failure about yourself  0 0 0  Trouble concentrating 0 0 0  Moving slowly or fidgety/restless 0 0 0  Suicidal thoughts 0 0 0  PHQ-9 Score 3 0 2       05/11/2022    8:40 AM 05/04/2021   10:40 AM 05/01/2020   11:40 AM 04/19/2019    1:42 PM  GAD 7 : Generalized Anxiety Score  Nervous, Anxious, on Edge 0 2 0 0  Control/stop worrying 0 2 0 0  Worry too much - different things 0 2 0 0  Trouble relaxing 0 0 0 0  Restless 0 0 0 0  Easily annoyed or irritable 0 0 0 0  Afraid - awful might happen 0 0 0 0  Total GAD 7 Score 0 6 0 0  Anxiety Difficulty    Not difficult at all      Upstream - 05/11/22 5621       Pregnancy Intention Screening   Does the patient want to become pregnant in the next year? No    Does the patient's partner want to become pregnant in the next year? No    Would the patient like to discuss contraceptive options  today? No      Contraception Wrap Up   Current Method Female Sterilization    End Method Female Sterilization    Contraception Counseling Provided No             Examination chaperoned by Malachy Mood LPN  Impression and plan: 1. Encounter for well woman exam with routine gynecological exam Pap and physical in 1 year Mammogram was negative 03/22/22 Labs with PCP Check with PCP on when last Cologuard was Talk with PCP about lung cancer screening low dose CT Follow up with PCP on BP, she is taking meds watch salt and sugars   2. Encounter for screening fecal occult blood testing Hemoccult was negative  - POCT occult blood stool  3. Hot flashes Has 5-6 per 24 hour period If persists or increases could try [place]   4. Smoker Declines to stop smoking at present

## 2022-05-12 ENCOUNTER — Other Ambulatory Visit: Payer: Self-pay | Admitting: Adult Health

## 2022-06-01 DIAGNOSIS — R7301 Impaired fasting glucose: Secondary | ICD-10-CM | POA: Diagnosis not present

## 2022-06-01 DIAGNOSIS — I10 Essential (primary) hypertension: Secondary | ICD-10-CM | POA: Diagnosis not present

## 2022-06-09 ENCOUNTER — Other Ambulatory Visit: Payer: Self-pay | Admitting: Adult Health

## 2022-06-09 DIAGNOSIS — K219 Gastro-esophageal reflux disease without esophagitis: Secondary | ICD-10-CM | POA: Diagnosis not present

## 2022-06-09 DIAGNOSIS — L918 Other hypertrophic disorders of the skin: Secondary | ICD-10-CM | POA: Diagnosis not present

## 2022-06-09 DIAGNOSIS — R7303 Prediabetes: Secondary | ICD-10-CM | POA: Diagnosis not present

## 2022-06-09 DIAGNOSIS — J449 Chronic obstructive pulmonary disease, unspecified: Secondary | ICD-10-CM | POA: Diagnosis not present

## 2022-06-09 DIAGNOSIS — E782 Mixed hyperlipidemia: Secondary | ICD-10-CM | POA: Diagnosis not present

## 2022-06-09 DIAGNOSIS — M62830 Muscle spasm of back: Secondary | ICD-10-CM | POA: Diagnosis not present

## 2022-06-09 DIAGNOSIS — F419 Anxiety disorder, unspecified: Secondary | ICD-10-CM | POA: Diagnosis not present

## 2022-06-09 DIAGNOSIS — Z0000 Encounter for general adult medical examination without abnormal findings: Secondary | ICD-10-CM | POA: Diagnosis not present

## 2022-06-09 DIAGNOSIS — I10 Essential (primary) hypertension: Secondary | ICD-10-CM | POA: Diagnosis not present

## 2022-06-09 DIAGNOSIS — Z0001 Encounter for general adult medical examination with abnormal findings: Secondary | ICD-10-CM | POA: Diagnosis not present

## 2022-06-09 DIAGNOSIS — F17200 Nicotine dependence, unspecified, uncomplicated: Secondary | ICD-10-CM | POA: Diagnosis not present

## 2022-09-15 DIAGNOSIS — D23122 Other benign neoplasm of skin of left lower eyelid, including canthus: Secondary | ICD-10-CM | POA: Diagnosis not present

## 2022-09-15 DIAGNOSIS — H25093 Other age-related incipient cataract, bilateral: Secondary | ICD-10-CM | POA: Diagnosis not present

## 2022-09-21 DIAGNOSIS — D23122 Other benign neoplasm of skin of left lower eyelid, including canthus: Secondary | ICD-10-CM | POA: Diagnosis not present

## 2022-12-06 DIAGNOSIS — R7301 Impaired fasting glucose: Secondary | ICD-10-CM | POA: Diagnosis not present

## 2022-12-06 DIAGNOSIS — I10 Essential (primary) hypertension: Secondary | ICD-10-CM | POA: Diagnosis not present

## 2022-12-06 DIAGNOSIS — E782 Mixed hyperlipidemia: Secondary | ICD-10-CM | POA: Diagnosis not present

## 2022-12-09 DIAGNOSIS — F17210 Nicotine dependence, cigarettes, uncomplicated: Secondary | ICD-10-CM | POA: Diagnosis not present

## 2022-12-09 DIAGNOSIS — R7303 Prediabetes: Secondary | ICD-10-CM | POA: Diagnosis not present

## 2022-12-09 DIAGNOSIS — F419 Anxiety disorder, unspecified: Secondary | ICD-10-CM | POA: Diagnosis not present

## 2022-12-09 DIAGNOSIS — M62830 Muscle spasm of back: Secondary | ICD-10-CM | POA: Diagnosis not present

## 2022-12-09 DIAGNOSIS — J449 Chronic obstructive pulmonary disease, unspecified: Secondary | ICD-10-CM | POA: Diagnosis not present

## 2022-12-09 DIAGNOSIS — E782 Mixed hyperlipidemia: Secondary | ICD-10-CM | POA: Diagnosis not present

## 2022-12-09 DIAGNOSIS — L299 Pruritus, unspecified: Secondary | ICD-10-CM | POA: Diagnosis not present

## 2022-12-09 DIAGNOSIS — I10 Essential (primary) hypertension: Secondary | ICD-10-CM | POA: Diagnosis not present

## 2022-12-09 DIAGNOSIS — K219 Gastro-esophageal reflux disease without esophagitis: Secondary | ICD-10-CM | POA: Diagnosis not present

## 2022-12-09 DIAGNOSIS — F17200 Nicotine dependence, unspecified, uncomplicated: Secondary | ICD-10-CM | POA: Diagnosis not present

## 2022-12-09 DIAGNOSIS — M519 Unspecified thoracic, thoracolumbar and lumbosacral intervertebral disc disorder: Secondary | ICD-10-CM | POA: Diagnosis not present

## 2023-03-02 ENCOUNTER — Other Ambulatory Visit (HOSPITAL_COMMUNITY): Payer: Self-pay | Admitting: Adult Health

## 2023-03-02 DIAGNOSIS — Z1231 Encounter for screening mammogram for malignant neoplasm of breast: Secondary | ICD-10-CM

## 2023-03-24 ENCOUNTER — Ambulatory Visit (HOSPITAL_COMMUNITY)
Admission: RE | Admit: 2023-03-24 | Discharge: 2023-03-24 | Disposition: A | Discharge status: Home / Self Care | Payer: 59 | Source: Ambulatory Visit | Attending: Adult Health | Admitting: Adult Health

## 2023-03-24 ENCOUNTER — Encounter (HOSPITAL_COMMUNITY): Payer: Self-pay

## 2023-03-24 DIAGNOSIS — Z1231 Encounter for screening mammogram for malignant neoplasm of breast: Secondary | ICD-10-CM | POA: Insufficient documentation

## 2023-03-28 ENCOUNTER — Telehealth: Payer: Self-pay | Admitting: *Deleted

## 2023-03-28 NOTE — Telephone Encounter (Signed)
-----   Message from Cyril Mourning sent at 03/28/2023  1:23 PM EDT ----- Let her know mammogram negative THX

## 2023-03-28 NOTE — Telephone Encounter (Signed)
 Left message @ 4:37 pm, letting pt know mammogram was negative. Next one due in 1 year. JSY

## 2023-06-15 ENCOUNTER — Encounter: Payer: Self-pay | Admitting: Adult Health

## 2023-06-15 ENCOUNTER — Ambulatory Visit: Admitting: Adult Health

## 2023-06-15 ENCOUNTER — Other Ambulatory Visit (HOSPITAL_COMMUNITY)
Admission: RE | Admit: 2023-06-15 | Discharge: 2023-06-15 | Disposition: A | Discharge status: Home / Self Care | Source: Ambulatory Visit | Attending: Adult Health | Admitting: Adult Health

## 2023-06-15 VITALS — BP 138/76 | HR 68 | Ht <= 58 in | Wt 167.0 lb

## 2023-06-15 DIAGNOSIS — F17200 Nicotine dependence, unspecified, uncomplicated: Secondary | ICD-10-CM | POA: Diagnosis not present

## 2023-06-15 DIAGNOSIS — Z1211 Encounter for screening for malignant neoplasm of colon: Secondary | ICD-10-CM | POA: Diagnosis not present

## 2023-06-15 DIAGNOSIS — Z1331 Encounter for screening for depression: Secondary | ICD-10-CM | POA: Diagnosis not present

## 2023-06-15 DIAGNOSIS — N907 Vulvar cyst: Secondary | ICD-10-CM

## 2023-06-15 DIAGNOSIS — Z01419 Encounter for gynecological examination (general) (routine) without abnormal findings: Secondary | ICD-10-CM | POA: Insufficient documentation

## 2023-06-15 LAB — HEMOCCULT GUIAC POC 1CARD (OFFICE): Fecal Occult Blood, POC: NEGATIVE

## 2023-06-15 NOTE — Progress Notes (Signed)
 Patient ID: KANNON BAUM, female   DOB: 12-Nov-1960, 63 y.o.   MRN: [id number] History of Present Illness: Stephanie Stevenson is a 32 year old white female, widowed, PM in for a well woman gyn exam and pap. She is engaged.  PCP is Dr Del Favia   Current Medications, Allergies, Past Medical History, Past Surgical History, Family History and Social History were reviewed in [employer] electronic medical record.     Review of Systems: Patient denies any headaches, hearing loss, fatigue, blurred vision, shortness of breath, chest pain, abdominal pain, problems with bowel movements, urination, or intercourse(not active). No joint pain or mood swings.  She has lost 22 lbs since last year,is trying    Physical Exam:BP 138/76 (BP Location: Left Arm, Patient Position: Sitting, Cuff Size: Normal)   Pulse 68   Ht 4' 9 (1.448 m)   Wt 167 lb (75.8 kg)   BMI 36.14 kg/m   General:  Well developed, well nourished, no acute distress Skin:  Warm and dry,tan Neck:  Midline trachea, normal thyroid, good ROM, no lymphadenopathy, no carotid bruits heard  Lungs; Clear to auscultation bilaterally Breast:  No dominant palpable mass, retraction, or nipple discharge Cardiovascular: Regular rate and rhythm Abdomen:  Soft, non tender, no hepatosplenomegaly Pelvic:  External genitalia is normal in appearance, has epidermal cyst right labia.  The vagina is pale.  Urethra has no lesions or masses. The cervix is smooth, pap with HR HPV genotyping performed.  Uterus is felt to be normal size, shape, and contour.  No adnexal masses or tenderness noted.Bladder is non tender, no masses felt. Rectal: Good sphincter tone, no polyps, or hemorrhoids felt.  Hemoccult negative. Extremities/musculoskeletal:  No swelling or varicosities noted, no clubbing or cyanosis Psych:  No mood changes, alert and cooperative,seems happy AA is 0 Fall risk is low    06/15/2023    1:32 PM 05/11/2022    8:40 AM 05/04/2021   10:40 AM  Depression screen  PHQ 2/9  Decreased Interest 0 0 0  Down, Depressed, Hopeless 0 0 0  PHQ - 2 Score 0 0 0  Altered sleeping 0 0 0  Tired, decreased energy 0 0 0  Change in appetite 0 3 0  Feeling bad or failure about yourself  0 0 0  Trouble concentrating 0 0 0  Moving slowly or fidgety/restless 0 0 0  Suicidal thoughts 0 0 0  PHQ-9 Score 0 3 0       06/15/2023    1:33 PM 05/11/2022    8:40 AM 05/04/2021   10:40 AM 05/01/2020   11:40 AM  GAD 7 : Generalized Anxiety Score  Nervous, Anxious, on Edge 0 0 2 0  Control/stop worrying 0 0 2 0  Worry too much - different things 0 0 2 0  Trouble relaxing 0 0 0 0  Restless 0 0 0 0  Easily annoyed or irritable 0 0 0 0  Afraid - awful might happen 0 0 0 0  Total GAD 7 Score 0 0 6 0      Upstream - 06/15/23 1339       Pregnancy Intention Screening   Does the patient want to become pregnant in the next year? N/A    Does the patient's partner want to become pregnant in the next year? N/A    Would the patient like to discuss contraceptive options today? N/A      Contraception Wrap Up   Current Method Female Sterilization   ablation; PM  End Method Female Sterilization   ablation;PM   Contraception Counseling Provided No            Examination chaperoned by Alphonso Aschoff LPN   Impression  and plan: 1. Encounter for gynecological examination with Papanicolaou smear of cervix (Primary) Pap sent Pap in 3 years if negative Physical in 1 year Mammogram was negative 03/24/23 Ask PCP about cologuard date Labs with PCP  - Cytology - PAP( [facility])  2. Encounter for screening fecal occult blood testing Hemoccult was negative   3. Smoker Not ready to quit  4. Epidermal cyst of vulva

## 2023-06-20 ENCOUNTER — Ambulatory Visit: Payer: Self-pay | Admitting: Adult Health
